# Patient Record
Sex: Male | Born: 1955 | Race: White | Hispanic: No | Marital: Married | Smoking: Never smoker
Health system: Southern US, Community
[De-identification: ages and names within clinical notes are randomized; demographics above are authoritative.]

## PROBLEM LIST (undated history)

## (undated) DIAGNOSIS — E119 Type 2 diabetes mellitus without complications: Secondary | ICD-10-CM

## (undated) DIAGNOSIS — Z9889 Other specified postprocedural states: Secondary | ICD-10-CM

## (undated) DIAGNOSIS — R112 Nausea with vomiting, unspecified: Secondary | ICD-10-CM

## (undated) DIAGNOSIS — C61 Malignant neoplasm of prostate: Secondary | ICD-10-CM

## (undated) DIAGNOSIS — K219 Gastro-esophageal reflux disease without esophagitis: Secondary | ICD-10-CM

## (undated) HISTORY — PX: TOENAIL EXCISION: SUR558

## (undated) HISTORY — PX: HERNIA REPAIR: SHX51

## (undated) HISTORY — PX: NOSE SURGERY: SHX723

---

## 2007-12-31 ENCOUNTER — Ambulatory Visit: Payer: Self-pay | Admitting: Family Medicine

## 2009-02-12 ENCOUNTER — Ambulatory Visit: Payer: Self-pay | Admitting: Unknown Physician Specialty

## 2009-07-11 ENCOUNTER — Ambulatory Visit: Payer: Self-pay | Admitting: Internal Medicine

## 2009-08-19 ENCOUNTER — Ambulatory Visit: Payer: Self-pay | Admitting: Internal Medicine

## 2010-03-14 ENCOUNTER — Ambulatory Visit: Payer: Self-pay | Admitting: Internal Medicine

## 2020-12-11 ENCOUNTER — Other Ambulatory Visit: Payer: Self-pay | Admitting: Urology

## 2020-12-11 DIAGNOSIS — R972 Elevated prostate specific antigen [PSA]: Secondary | ICD-10-CM

## 2020-12-24 ENCOUNTER — Other Ambulatory Visit: Payer: Self-pay | Admitting: Urology

## 2020-12-24 DIAGNOSIS — Z1389 Encounter for screening for other disorder: Secondary | ICD-10-CM

## 2020-12-24 DIAGNOSIS — R972 Elevated prostate specific antigen [PSA]: Secondary | ICD-10-CM

## 2020-12-25 ENCOUNTER — Other Ambulatory Visit: Payer: Self-pay

## 2020-12-25 ENCOUNTER — Ambulatory Visit
Admission: RE | Admit: 2020-12-25 | Discharge: 2020-12-25 | Disposition: A | Discharge status: Home / Self Care | Payer: BC Managed Care – PPO | Source: Ambulatory Visit | Attending: Urology | Admitting: Urology

## 2020-12-25 DIAGNOSIS — R972 Elevated prostate specific antigen [PSA]: Secondary | ICD-10-CM | POA: Insufficient documentation

## 2020-12-25 DIAGNOSIS — Z1389 Encounter for screening for other disorder: Secondary | ICD-10-CM | POA: Diagnosis present

## 2020-12-25 MED ORDER — GADOBUTROL 1 MMOL/ML IV SOLN
9.0000 mL | Freq: Once | INTRAVENOUS | Status: AC | PRN
  Administered 2020-12-25: 9 mL via INTRAVENOUS

## 2021-01-23 ENCOUNTER — Other Ambulatory Visit: Payer: BC Managed Care – PPO

## 2021-01-29 ENCOUNTER — Other Ambulatory Visit: Payer: BC Managed Care – PPO

## 2021-02-19 ENCOUNTER — Inpatient Hospital Stay
Admission: RE | Admit: 2021-02-19 | Discharge: 2021-02-19 | Disposition: A | Discharge status: Home / Self Care | Payer: BC Managed Care – PPO | Source: Ambulatory Visit | Attending: Urology | Admitting: Urology

## 2021-02-19 NOTE — Patient Instructions (Addendum)
Your procedure is scheduled on: February 28, 2021 THURSDAY Report to the Registration Desk on the 1st floor of the [facility]. To find out your arrival time, please call [phone] between 1PM - 3PM on: Wednesday June 8,2022  REMEMBER: Instructions that are not followed completely may result in serious medical risk, up to and including death; or upon the discretion of your surgeon and anesthesiologist your surgery may need to be rescheduled.  Do not eat food after midnight the night before surgery.  No gum chewing, lozengers or hard candies.  You may however, drink CLEAR liquids up to 2 hours before you are scheduled to arrive for your surgery. Do not drink anything within 2 hours of your scheduled arrival time.  Clear liquids include: - water  Do NOT drink anything that is not on this list.  Type 1 and Type 2 diabetics should only drink water.  TAKE THESE MEDICATIONS THE MORNING OF SURGERY WITH A SIP OF WATER:  (take one the night before and one on the morning of surgery - helps to prevent nausea after surgery.)  Use inhalers on the day of surgery and bring to the hospital.  Stop Metformin 2 days prior to surgery. LAST DOSE OF METFORMIN February 25, 2021 Monday. LAST DOSE OF INVOKANA (CANAGLIFLOZIN) IS February 24, 2021 SUNDAY  Take 1/2 of usual insulin dose the night before surgery and none on the morning of surgery.  Follow recommendations from Cardiologist, Pulmonologist or PCP regarding stopping Aspirin, Coumadin, Plavix, Eliquis, Pradaxa, or Pletal.  One week prior to surgery: Stop Anti-inflammatories (NSAIDS) such as Advil, Aleve, Ibuprofen, Motrin, Naproxen, Naprosyn and Aspirin based products such as Excedrin, Goodys Powder, BC Powder. Stop ANY OVER THE COUNTER supplements until after surgery. You may however, continue to take Tylenol if needed for pain up until the day of surgery.  No Alcohol for 24 hours before or after surgery.  No Smoking including e-cigarettes for 24 hours  prior to surgery.  No chewable tobacco products for at least 6 hours prior to surgery.  No nicotine patches on the day of surgery.  Do not use any "recreational" drugs for at least a week prior to your surgery.  Please be advised that the combination of cocaine and anesthesia may have negative outcomes, up to and including death. If you test positive for cocaine, your surgery will be cancelled.  On the morning of surgery brush your teeth with toothpaste and water, you may rinse your mouth with mouthwash if you wish. Do not swallow any toothpaste or mouthwash.  Do not wear jewelry, make-up, hairpins, clips or nail polish.  Do not wear lotions, powders, or perfumes.   Do not shave body from the neck down 48 hours prior to surgery just in case you cut yourself which could leave a site for infection.  Also, freshly shaved skin may become irritated if using the CHG soap.  Contact lenses, hearing aids and dentures may not be worn into surgery.  Do not bring valuables to the hospital. [facility] is not responsible for any missing/lost belongings or valuables.    Use CHG Soap as directed on instruction sheet.  Fleets enema as directed until clear.  Bring your C-PAP to the hospital with you in case you may have to spend the night.   Notify your doctor if there is any change in your medical condition (cold, fever, infection).  Wear comfortable clothing (specific to your surgery type) to the hospital.  Plan for stool softeners for home  use; pain medications have a tendency to cause constipation. You can also help prevent constipation by eating foods high in fiber such as fruits and vegetables and drinking plenty of fluids as your diet allows.  After surgery, you can help prevent lung complications by doing breathing exercises.  Take deep breaths and cough every 1-2 hours. Your doctor may order a device called an Incentive Spirometer to help you take deep breaths. When coughing or sneezing,  hold a pillow firmly against your incision with both hands. This is called "splinting." Doing this helps protect your incision. It also decreases belly discomfort.  If you are being admitted to the hospital overnight, leave your suitcase in the car. After surgery it may be brought to your room.  If you are being discharged the day of surgery, you will not be allowed to drive home. You will need a responsible adult (18 years or older) to drive you home and stay with you that night.   If you are taking public transportation, you will need to have a responsible adult (18 years or older) with you. Please confirm with your physician that it is acceptable to use public transportation.   Please call the [place] [facility] at [phone] if you have any questions about these instructions.  Surgery Visitation Policy:  Patients undergoing a surgery or procedure may have one family member or support person with them as long as that person is not COVID-19 positive or experiencing its symptoms.  That person may remain in the waiting area during the procedure.  Inpatient Visitation:    Visiting hours are 7 a.m. to 8 p.m. Inpatients will be allowed two visitors daily. The visitors may change each day during the patient's stay. No visitors under the age of 23. Any visitor under the age of 80 must be accompanied by an adult. The visitor must pass COVID-19 screenings, use hand sanitizer when entering and exiting the patient's room and wear a mask at all times, including in the patient's room. Patients must also wear a mask when staff or their visitor are in the room. Masking is required regardless of vaccination status.  Total Hip/Knee Replacement Preoperative Educational Video  To better prepare for surgery, please view our videos that explain the physical activity and discharge planning required to have the best surgical recovery at [facility].  [email or web address]      Questions? Call [phone] or email [email or web address]

## 2021-02-19 NOTE — H&P (Signed)
[name], [name] MEDICAL RECORD NO: [id number] ACCOUNT NO: [id number] DATE OF BIRTH: Jan 27, 1956 PHYSICIAN: Otelia Limes. Yves Dill, MD  History and Physical   DATE OF ADMISSION: 02/28/2021  Same day surgery 02/28/2021.  CHIEF COMPLAINT:  Elevated PSA and abnormal prostate gland MRI scan.  HISTORY OF PRESENT ILLNESS:  The patient is a 65 year old Caucasian male who was found to have an elevated PSA of 6.6 ng/mL, on 12/06/2020.  This was subsequently evaluated with a prostate gland MRI scan on 12/25/2020, which indicated a 37.8 mL prostate  and a 2 PI-RADS category 4 lesions in the right mid gland extending to the apex suspicious for high-grade prostate cancer.  The patient comes in now for UroNav fusion biopsy of the prostate.  PAST MEDICAL HISTORY: ALLERGIES:  ALLERGIC TO CODEINE.  CURRENT MEDICATIONS:  Included Nexium, Invokana, metformin, Ozempic, Lantus insulin, olmesartan, baby aspirin, tadalafil and AndroGel.  PAST SURGICAL HISTORY:  1.  Left inguinal herniorrhaphy in 1960. 2.  Nose surgery. 3.  Toe surgery in 1960s.  PAST AND CURRENT MEDICAL CONDITIONS: 1.  Type 2 diabetes. 2.  Hypertriglyceridemia. 3.  Albuminuria. 4.  Hypogonadism. 5.  Erectile dysfunction. 6.  Vitamin B12 deficiency. 7.  Allergic rhinitis. 8.  GERD.  REVIEW OF SYSTEMS:  Patient denies chest pain, shortness of breath, stroke or hypertension or coronary artery disease.  SOCIAL HISTORY:  Patient denied tobacco or alcohol use.  FAMILY HISTORY:  The patient has a brother with diabetes.  There is no family history of prostate cancer.  PHYSICAL EXAMINATION: VITAL SIGNS:  Height 5 feet 6 inches, weight 199 pounds, BMI 32. GENERAL:  Well-nourished white male in no acute distress. HEENT:  Sclerae were clear.  Pupils are equally round, reactive to light and accommodation.  Extraocular movements are intact. NECK:  No palpable masses and no audible carotid bruits. PULMONARY:  Lungs are clear to  auscultation. CARDIOVASCULAR:  Regular rhythm and rate. LYMPHATIC:  No palpable cervical or inguinal adenopathy. GENITOURINARY:  Patient was circumcised.  Testes were both smooth, nontender, approximately 20 mL in size each. RECTAL:  35 gram smooth, nontender prostate. NEUROLOGICAL:  Alert and oriented x3.  IMPRESSION:  1.  Elevated PSA. 2.  Abnormal prostate MRI scan with 2 PI-RADS category 4 lesions.  PLAN:  UroNav fusion biopsy of the prostate.   PUS D: 02/14/2021 5:37:28 pm T: 02/14/2021 6:17:00 pm  JOB: [id number]/ [id number]

## 2021-02-21 ENCOUNTER — Encounter
Admission: RE | Admit: 2021-02-21 | Discharge: 2021-02-21 | Disposition: A | Discharge status: Home / Self Care | Payer: BC Managed Care – PPO | Source: Ambulatory Visit | Attending: Urology | Admitting: Urology

## 2021-02-21 ENCOUNTER — Other Ambulatory Visit: Payer: Self-pay

## 2021-02-21 HISTORY — DX: Type 2 diabetes mellitus without complications: E11.9

## 2021-02-21 HISTORY — DX: Other specified postprocedural states: R11.2

## 2021-02-21 HISTORY — DX: Gastro-esophageal reflux disease without esophagitis: K21.9

## 2021-02-21 HISTORY — DX: Other specified postprocedural states: Z98.890

## 2021-02-21 NOTE — Patient Instructions (Addendum)
Your procedure is scheduled on: 02/28/21 Report to [place]. To find out your arrival time please call [phone] between 1PM - 3PM on 02/27/21.  Remember: Instructions that are not followed completely may result in serious medical risk, up to and including death, or upon the discretion of your surgeon and anesthesiologist your surgery may need to be rescheduled.     _X__ 1. Do not eat food after midnight the night before your procedure.                 No gum chewing or hard candies. You may drink clear liquids up to 2 hours                 before you are scheduled to arrive for your surgery-                   Diabetics water only  __X__2.  On the morning of surgery brush your teeth with toothpaste and water, you                 may rinse your mouth with mouthwash if you wish.  Do not swallow any              toothpaste of mouthwash.     _X__ 3.  No Alcohol for 24 hours before or after surgery.   _X__ 4.  Do Not Smoke or use e-cigarettes For 24 Hours Prior to Your Surgery.                 Do not use any chewable tobacco products for at least 6 hours prior to                 surgery.  ____  5.  Bring all medications with you on the day of surgery if instructed.   __X__  6.  Notify your doctor if there is any change in your medical condition      (cold, fever, infections).     Do not wear jewelry, make-up, hairpins, clips or nail polish. Do not wear lotions, powders, or perfumes. You may put on a small amount of deodorant. Do not shave body hair 48 hours prior to surgery. Men may shave face and neck. Do not bring valuables to the hospital.    [facility] is not responsible for any belongings or valuables.  Contacts, dentures/partials or body piercings may not be worn into surgery. Bring a case for your contacts, glasses or hearing aids, a denture cup will be supplied. Leave your suitcase in the car. After surgery it may be  brought to your room. For patients admitted to the hospital, discharge time is determined by your treatment team.   Patients discharged the day of surgery will not be allowed to drive home.   Please read over the following fact sheets that you were given:     __X__ Take these medicines the morning of surgery with A SIP OF WATER:    1. esomeprazole (NEXIUM) 40 MG capsule  2.   3.   4.  5.  6.  __X__ Fleet Enema (as directed) UNTIL CLEAR (no poop)  ____ Use CHG Soap/SAGE wipes as directed  ____ Use inhalers on the day of surgery  __X__ Stop Metformin 48 hours before surgery, Invokana 72 hours before surgery   __X__ Decrease bedtime insulin dose to 20 units.. No insulin the morning  of surgery.   ____ Stop Blood Thinners Coumadin/Plavix/Xarelto/Pleta/Pradaxa/Eliquis/Effient/Aspirin  on   Or contact your Surgeon, Cardiologist or Medical Doctor regarding  ability to stop your blood thinners  __X__ Stop Anti-inflammatories 7 days before surgery such as Advil, Ibuprofen, Motrin,  BC or Goodies Powder, Naprosyn, Naproxen, Aleve, Aspirin    __X__ Stop all herbal supplements, fish oil or vitamin E until after surgery.    ____ Bring C-Pap to the hospital.

## 2021-02-28 ENCOUNTER — Ambulatory Visit: Payer: BC Managed Care – PPO | Admitting: Certified Registered Nurse Anesthetist

## 2021-02-28 ENCOUNTER — Ambulatory Visit
Admission: RE | Admit: 2021-02-28 | Discharge: 2021-02-28 | Disposition: A | Discharge status: Home / Self Care | Payer: BC Managed Care – PPO | Attending: Urology | Admitting: Urology

## 2021-02-28 ENCOUNTER — Other Ambulatory Visit: Payer: Self-pay

## 2021-02-28 ENCOUNTER — Encounter
Admission: RE | Disposition: A | Discharge status: Home / Self Care | Payer: Self-pay | Source: Home / Self Care | Attending: Urology

## 2021-02-28 ENCOUNTER — Encounter: Payer: Self-pay | Admitting: Urology

## 2021-02-28 DIAGNOSIS — Z7984 Long term (current) use of oral hypoglycemic drugs: Secondary | ICD-10-CM | POA: Diagnosis not present

## 2021-02-28 DIAGNOSIS — Z885 Allergy status to narcotic agent status: Secondary | ICD-10-CM | POA: Diagnosis not present

## 2021-02-28 DIAGNOSIS — Z7982 Long term (current) use of aspirin: Secondary | ICD-10-CM | POA: Insufficient documentation

## 2021-02-28 DIAGNOSIS — R972 Elevated prostate specific antigen [PSA]: Secondary | ICD-10-CM | POA: Diagnosis present

## 2021-02-28 DIAGNOSIS — Z794 Long term (current) use of insulin: Secondary | ICD-10-CM | POA: Insufficient documentation

## 2021-02-28 DIAGNOSIS — Z79899 Other long term (current) drug therapy: Secondary | ICD-10-CM | POA: Diagnosis not present

## 2021-02-28 DIAGNOSIS — C61 Malignant neoplasm of prostate: Secondary | ICD-10-CM | POA: Insufficient documentation

## 2021-02-28 HISTORY — PX: PROSTATE BIOPSY: SHX241

## 2021-02-28 LAB — GLUCOSE, CAPILLARY
Glucose-Capillary: 149 mg/dL — ABNORMAL HIGH (ref 70–99)
Glucose-Capillary: 121 mg/dL — ABNORMAL HIGH (ref 70–99)

## 2021-02-28 SURGERY — BIOPSY, PROSTATE
Anesthesia: General

## 2021-02-28 MED ORDER — GLYCOPYRROLATE 0.2 MG/ML IJ SOLN
INTRAMUSCULAR | Status: AC
  Filled 2021-02-28: qty 1

## 2021-02-28 MED ORDER — PHENYLEPHRINE HCL (PRESSORS) 10 MG/ML IV SOLN
INTRAVENOUS | Status: AC
  Filled 2021-02-28: qty 1

## 2021-02-28 MED ORDER — GLYCOPYRROLATE 0.2 MG/ML IJ SOLN
INTRAMUSCULAR | Status: DC | PRN
  Administered 2021-02-28: .2 mg via INTRAVENOUS

## 2021-02-28 MED ORDER — FENTANYL CITRATE (PF) 100 MCG/2ML IJ SOLN
INTRAMUSCULAR | Status: DC | PRN
  Administered 2021-02-28: 100 ug via INTRAVENOUS

## 2021-02-28 MED ORDER — ACETAMINOPHEN 10 MG/ML IV SOLN
INTRAVENOUS | Status: AC
  Filled 2021-02-28: qty 100

## 2021-02-28 MED ORDER — ROCURONIUM BROMIDE 100 MG/10ML IV SOLN
INTRAVENOUS | Status: DC | PRN
  Administered 2021-02-28: 60 mg via INTRAVENOUS

## 2021-02-28 MED ORDER — ONDANSETRON HCL 4 MG/2ML IJ SOLN
INTRAMUSCULAR | Status: DC | PRN
  Administered 2021-02-28: 4 mg via INTRAVENOUS

## 2021-02-28 MED ORDER — GENTAMICIN SULFATE 40 MG/ML IJ SOLN
80.0000 mg | Freq: Once | INTRAVENOUS | Status: AC
  Administered 2021-02-28: 80 mg via INTRAVENOUS
  Filled 2021-02-28: qty 2

## 2021-02-28 MED ORDER — MIDAZOLAM HCL 2 MG/2ML IJ SOLN
INTRAMUSCULAR | Status: DC | PRN
  Administered 2021-02-28: 2 mg via INTRAVENOUS

## 2021-02-28 MED ORDER — ORAL CARE MOUTH RINSE: 15.0000 mL | Freq: Once | OROMUCOSAL | Status: AC

## 2021-02-28 MED ORDER — SUGAMMADEX SODIUM 200 MG/2ML IV SOLN
INTRAVENOUS | Status: DC | PRN
  Administered 2021-02-28: 200 mg via INTRAVENOUS

## 2021-02-28 MED ORDER — CHLORHEXIDINE GLUCONATE 0.12 % MT SOLN: 15.0000 mL | Freq: Once | OROMUCOSAL | Status: AC

## 2021-02-28 MED ORDER — LIDOCAINE HCL (PF) 2 % IJ SOLN
INTRAMUSCULAR | Status: AC
  Filled 2021-02-28: qty 5

## 2021-02-28 MED ORDER — MIDAZOLAM HCL 2 MG/2ML IJ SOLN
INTRAMUSCULAR | Status: AC
  Filled 2021-02-28: qty 2

## 2021-02-28 MED ORDER — CHLORHEXIDINE GLUCONATE 0.12 % MT SOLN
OROMUCOSAL | Status: AC
  Administered 2021-02-28: 15 mL via OROMUCOSAL
  Filled 2021-02-28: qty 15

## 2021-02-28 MED ORDER — DEXAMETHASONE SODIUM PHOSPHATE 10 MG/ML IJ SOLN
INTRAMUSCULAR | Status: DC | PRN
  Administered 2021-02-28: 10 mg via INTRAVENOUS

## 2021-02-28 MED ORDER — SODIUM CHLORIDE 0.9 % IV SOLN
INTRAVENOUS | Status: AC
  Filled 2021-02-28: qty 10

## 2021-02-28 MED ORDER — SODIUM CHLORIDE 0.9 % IV SOLN
1.0000 g | Freq: Once | INTRAVENOUS | Status: AC
  Administered 2021-02-28: 1 g via INTRAVENOUS

## 2021-02-28 MED ORDER — PROPOFOL 10 MG/ML IV BOLUS
INTRAVENOUS | Status: DC | PRN
  Administered 2021-02-28: 170 mg via INTRAVENOUS

## 2021-02-28 MED ORDER — ACETAMINOPHEN 10 MG/ML IV SOLN
INTRAVENOUS | Status: DC | PRN
  Administered 2021-02-28: 1000 mg via INTRAVENOUS

## 2021-02-28 MED ORDER — SODIUM CHLORIDE 0.9 % IV SOLN: INTRAVENOUS | Status: DC

## 2021-02-28 MED ORDER — FLEET ENEMA 7-19 GM/118ML RE ENEM: 1.0000 | ENEMA | Freq: Once | RECTAL | Status: DC

## 2021-02-28 MED ORDER — ONDANSETRON HCL 4 MG/2ML IJ SOLN
INTRAMUSCULAR | Status: AC
  Filled 2021-02-28: qty 2

## 2021-02-28 MED ORDER — FENTANYL CITRATE (PF) 100 MCG/2ML IJ SOLN
INTRAMUSCULAR | Status: AC
  Filled 2021-02-28: qty 2

## 2021-02-28 MED ORDER — PROPOFOL 10 MG/ML IV BOLUS
INTRAVENOUS | Status: AC
  Filled 2021-02-28: qty 40

## 2021-02-28 MED ORDER — PHENYLEPHRINE HCL (PRESSORS) 10 MG/ML IV SOLN
INTRAVENOUS | Status: DC | PRN
  Administered 2021-02-28 (×2): 100 ug via INTRAVENOUS

## 2021-02-28 MED ORDER — DEXAMETHASONE SODIUM PHOSPHATE 10 MG/ML IJ SOLN
INTRAMUSCULAR | Status: AC
  Filled 2021-02-28: qty 1

## 2021-02-28 MED ORDER — LEVOFLOXACIN 500 MG PO TABS: 500.0000 mg | ORAL_TABLET | Freq: Every day | ORAL | 1 refills | Status: AC

## 2021-02-28 MED ORDER — LIDOCAINE HCL (CARDIAC) PF 100 MG/5ML IV SOSY
PREFILLED_SYRINGE | INTRAVENOUS | Status: DC | PRN
  Administered 2021-02-28: 100 mg via INTRAVENOUS

## 2021-02-28 SURGICAL SUPPLY — 22 items
COVER MAYO STAND REUSABLE (DRAPES) ×3 IMPLANT
COVER TRANSDUCER ULTRASOUND (MISCELLANEOUS) ×2 IMPLANT
FEE DELIVERY LASER CO2 FORTEC (MISCELLANEOUS) IMPLANT
GLOVE SURG ENC MOIS LTX SZ7 (GLOVE) ×6 IMPLANT
GUIDE NDL ENDOCAV 16-18 CVR (NEEDLE) IMPLANT
GUIDE NDL URONAV ULTRASND S (MISCELLANEOUS) IMPLANT
GUIDE NEEDLE ENDOCAV 16-18 CVR (NEEDLE) IMPLANT
GUIDE NEEDLE URONAV ULTRASND S (MISCELLANEOUS) ×1 IMPLANT
INST BIOPSY MAXCORE 18GX25 (NEEDLE) ×3 IMPLANT
LASER CO2 FORTEC DELIVERY FEE (MISCELLANEOUS) ×3 IMPLANT
MANIFOLD NEPTUNE II (INSTRUMENTS) ×1 IMPLANT
NDL GUIDE BIOPSY 644068 (NEEDLE) IMPLANT
NEEDLE GUIDE BIOPSY 644068 (NEEDLE) IMPLANT
PROBE BIOSP ALOKA ALPHA6 PROST (MISCELLANEOUS) ×2 IMPLANT
PROBE URONAV BK 8808E 8818 HLD (MISCELLANEOUS) IMPLANT
STRAP SAFETY 5IN WIDE (MISCELLANEOUS) ×3 IMPLANT
SURGILUBE 2OZ TUBE FLIPTOP (MISCELLANEOUS) ×3 IMPLANT
TOWEL OR 17X26 4PK STRL BLUE (TOWEL DISPOSABLE) ×3 IMPLANT
URONAV BK 8808E 8818 PROBE HLD (MISCELLANEOUS) ×3
URONAV MRI FUSION TWO PATIENTS (MISCELLANEOUS) ×2 IMPLANT
URONAV ULTRASOUND (MISCELLANEOUS) ×2 IMPLANT
URONAV ULTRASOUND NDL GUIDE S (MISCELLANEOUS) ×3

## 2021-02-28 NOTE — Op Note (Signed)
Preoperative diagnosis: 1.  Elevated PSA (R97.2)                                           2.  Abnormal prostate gland MRI scan (D40.0)  Postoperative diagnosis: Same  Procedure: Transrectal Uronav fusion biopsy of the prostate gland ([facility], [phone])  Surgeon: Otelia Limes. Yves Dill MD  Anesthesia: General  Indications:See the history and physical. After informed consent the above procedure(s) were requested     Technique and findings: After adequate general anesthesia been obtained the patient was placed into left lateral decubitus position.  Digital sweep of the rectum indicated that the rectal vault was clear.  The ultrasound probe was then placed and edges acquired.  Ultrasound images were then fused with the MRI images.  Region of interest #1 was identified and 3 core biopsies obtained from this location.  Region of interest #2 was identified and 3 core biopsies obtained from this location.  At this point standard 12 core systematic core biopsies were performed.  The ultrasound probe was then removed.  Blood loss was minimal.  The procedure was then terminated and patient transferred to the recovery room in stable condition.

## 2021-02-28 NOTE — Anesthesia Preprocedure Evaluation (Addendum)
Anesthesia Evaluation  Patient identified by MRN, date of birth, ID band Patient awake    History of Anesthesia Complications Negative for: history of anesthetic complications  Airway Mallampati: I  TM Distance: >3 FB     Dental no notable dental hx.    Pulmonary neg pulmonary ROS,    Pulmonary exam normal breath sounds clear to auscultation       Cardiovascular Exercise Tolerance: Good hypertension (controlled), Pt. on medications Normal cardiovascular exam Rhythm:Regular Rate:Normal     Neuro/Psych negative neurological ROS  negative psych ROS   GI/Hepatic Neg liver ROS, hiatal hernia (controlled),   Endo/Other  diabetes, Well Controlled  Renal/GU negative Renal ROS  negative genitourinary   Musculoskeletal negative musculoskeletal ROS (+)   Abdominal Normal abdominal exam  (+)   Peds negative pediatric ROS (+)  Hematology negative hematology ROS (+)   Anesthesia Other Findings   Reproductive/Obstetrics negative OB ROS                           Anesthesia Physical Anesthesia Plan  ASA: 2  Anesthesia Plan: General   Post-op Pain Management:    Induction: Intravenous  PONV Risk Score and Plan: Ondansetron  Airway Management Planned: Oral ETT  Additional Equipment:   Intra-op Plan:   Post-operative Plan:   Informed Consent: I have reviewed the patients History and Physical, chart, labs and discussed the procedure including the risks, benefits and alternatives for the proposed anesthesia with the patient or authorized representative who has indicated his/her understanding and acceptance.       Plan Discussed with: CRNA  Anesthesia Plan Comments:         Anesthesia Quick Evaluation

## 2021-02-28 NOTE — Anesthesia Procedure Notes (Signed)
Procedure Name: Intubation Date/Time: 02/28/2021 7:58 AM Performed by: Lily Peer, Kariann Wecker, CRNA Pre-anesthesia Checklist: Patient identified, Emergency Drugs available, Suction available and Patient being monitored Patient Re-evaluated:Patient Re-evaluated prior to induction Oxygen Delivery Method: Circle system utilized Preoxygenation: Pre-oxygenation with 100% oxygen Induction Type: IV induction Ventilation: Mask ventilation without difficulty Laryngoscope Size: McGraph and 4 Grade View: Grade I Tube type: Oral Number of attempts: 1 Airway Equipment and Method: Stylet Placement Confirmation: ETT inserted through vocal cords under direct vision, positive ETCO2 and breath sounds checked- equal and bilateral Secured at: 21 cm Tube secured with: Tape Dental Injury: Teeth and Oropharynx as per pre-operative assessment

## 2021-02-28 NOTE — H&P (Signed)
Date of Initial H&P: 02/14/21  History reviewed, patient examined, no change in status, stable for surgery.

## 2021-02-28 NOTE — Transfer of Care (Signed)
Immediate Anesthesia Transfer of Care Note  Patient: [name]  Procedure(s) Performed: PROSTATE BIOPSY URONAV PROSTATE  Patient Location: PACU  Anesthesia Type:General  Level of Consciousness: drowsy  Airway & Oxygen Therapy: Patient Spontanous Breathing and Patient connected to face mask oxygen  Post-op Assessment: Report given to RN and Post -op Vital signs reviewed and stable  Post vital signs: Reviewed and stable  Last Vitals:  Vitals Value Taken Time  BP 106/71 02/28/21 0826  Temp    Pulse 56 02/28/21 0828  Resp 18 02/28/21 0828  SpO2 99 % 02/28/21 0828  Vitals shown include unvalidated device data.  Last Pain:  Vitals:   02/28/21 0610  TempSrc: Oral  PainSc: 4          Complications: No notable events documented.

## 2021-02-28 NOTE — Discharge Instructions (Signed)

## 2021-03-01 LAB — SURGICAL PATHOLOGY

## 2021-03-01 NOTE — Anesthesia Postprocedure Evaluation (Signed)
Anesthesia Post Note  Patient: [name]  Procedure(s) Performed: PROSTATE BIOPSY [place] PROSTATE  Patient location during evaluation: PACU Anesthesia Type: General Level of consciousness: awake and alert Pain management: pain level controlled Vital Signs Assessment: post-procedure vital signs reviewed and stable Respiratory status: spontaneous breathing, nonlabored ventilation, respiratory function stable and patient connected to nasal cannula oxygen Cardiovascular status: blood pressure returned to baseline and stable Postop Assessment: no apparent nausea or vomiting Anesthetic complications: no   No notable events documented.   Last Vitals:  Vitals:   02/28/21 0857 02/28/21 0913  BP: 121/87 127/84  Pulse: 64 64  Resp: 13 16  Temp: (!) 36.1 C (!) 36.1 C  SpO2: 99% 99%    Last Pain:  Vitals:   03/01/21 0808  TempSrc:   PainSc: 0-No pain                 Tonny Bollman

## 2021-03-04 ENCOUNTER — Encounter: Payer: Self-pay | Admitting: Urology

## 2022-01-23 ENCOUNTER — Emergency Department (HOSPITAL_COMMUNITY): Payer: BC Managed Care – PPO

## 2022-01-23 ENCOUNTER — Emergency Department (HOSPITAL_COMMUNITY)
Admission: EM | Admit: 2022-01-23 | Discharge: 2022-01-23 | Disposition: A | Discharge status: Home / Self Care | Payer: BC Managed Care – PPO | Attending: Emergency Medicine | Admitting: Emergency Medicine

## 2022-01-23 ENCOUNTER — Other Ambulatory Visit: Payer: Self-pay

## 2022-01-23 ENCOUNTER — Encounter (HOSPITAL_COMMUNITY): Payer: Self-pay | Admitting: Emergency Medicine

## 2022-01-23 DIAGNOSIS — Z794 Long term (current) use of insulin: Secondary | ICD-10-CM | POA: Insufficient documentation

## 2022-01-23 DIAGNOSIS — Z7984 Long term (current) use of oral hypoglycemic drugs: Secondary | ICD-10-CM | POA: Diagnosis not present

## 2022-01-23 DIAGNOSIS — Z7982 Long term (current) use of aspirin: Secondary | ICD-10-CM | POA: Insufficient documentation

## 2022-01-23 DIAGNOSIS — E119 Type 2 diabetes mellitus without complications: Secondary | ICD-10-CM | POA: Insufficient documentation

## 2022-01-23 DIAGNOSIS — R4182 Altered mental status, unspecified: Secondary | ICD-10-CM | POA: Diagnosis present

## 2022-01-23 DIAGNOSIS — R410 Disorientation, unspecified: Secondary | ICD-10-CM | POA: Diagnosis not present

## 2022-01-23 DIAGNOSIS — Z8546 Personal history of malignant neoplasm of prostate: Secondary | ICD-10-CM | POA: Diagnosis not present

## 2022-01-23 DIAGNOSIS — R600 Localized edema: Secondary | ICD-10-CM | POA: Diagnosis not present

## 2022-01-23 HISTORY — DX: Malignant neoplasm of prostate: C61

## 2022-01-23 LAB — COMPREHENSIVE METABOLIC PANEL
ALT: 13 U/L (ref 0–44)
AST: 16 U/L (ref 15–41)
Albumin: 3.6 g/dL (ref 3.5–5.0)
Alkaline Phosphatase: 65 U/L (ref 38–126)
Anion gap: 12 (ref 5–15)
BUN: 15 mg/dL (ref 8–23)
CO2: 21 mmol/L — ABNORMAL LOW (ref 22–32)
Calcium: 9.2 mg/dL (ref 8.9–10.3)
Chloride: 106 mmol/L (ref 98–111)
Creatinine, Ser: 1.35 mg/dL — ABNORMAL HIGH (ref 0.61–1.24)
GFR, Estimated: 59 mL/min — ABNORMAL LOW (ref >60)
Glucose, Bld: 189 mg/dL — ABNORMAL HIGH (ref 70–99)
Potassium: 4.1 mmol/L (ref 3.5–5.1)
Sodium: 139 mmol/L (ref 135–145)
Total Bilirubin: 0.9 mg/dL (ref 0.3–1.2)
Total Protein: 6.3 g/dL — ABNORMAL LOW (ref 6.5–8.1)

## 2022-01-23 LAB — CBC WITH DIFFERENTIAL/PLATELET
Abs Immature Granulocytes: 0.02 10*3/uL (ref 0.00–0.07)
Basophils Absolute: 0 10*3/uL (ref 0.0–0.1)
Basophils Relative: 1 %
Eosinophils Absolute: 0.2 10*3/uL (ref 0.0–0.5)
Eosinophils Relative: 2 %
HCT: 38 % — ABNORMAL LOW (ref 39.0–52.0)
Hemoglobin: 12.6 g/dL — ABNORMAL LOW (ref 13.0–17.0)
Immature Granulocytes: 0 %
Lymphocytes Relative: 32 %
Lymphs Abs: 2.6 10*3/uL (ref 0.7–4.0)
MCH: 28.4 pg (ref 26.0–34.0)
MCHC: 33.2 g/dL (ref 30.0–36.0)
MCV: 85.6 fL (ref 80.0–100.0)
Monocytes Absolute: 0.5 10*3/uL (ref 0.1–1.0)
Monocytes Relative: 6 %
Neutro Abs: 4.8 10*3/uL (ref 1.7–7.7)
Neutrophils Relative %: 59 %
Platelets: 262 10*3/uL (ref 150–400)
RBC: 4.44 MIL/uL (ref 4.22–5.81)
RDW: 13.1 % (ref 11.5–15.5)
WBC: 8.1 10*3/uL (ref 4.0–10.5)
nRBC: 0 % (ref 0.0–0.2)

## 2022-01-23 LAB — I-STAT VENOUS BLOOD GAS, ED
Acid-base deficit: 3 mmol/L — ABNORMAL HIGH (ref 0.0–2.0)
Bicarbonate: 21.4 mmol/L (ref 20.0–28.0)
Calcium, Ion: 1.19 mmol/L (ref 1.15–1.40)
HCT: 36 % — ABNORMAL LOW (ref 39.0–52.0)
Hemoglobin: 12.2 g/dL — ABNORMAL LOW (ref 13.0–17.0)
O2 Saturation: 83 %
Potassium: 3.7 mmol/L (ref 3.5–5.1)
Sodium: 138 mmol/L (ref 135–145)
TCO2: 22 mmol/L (ref 22–32)
pCO2, Ven: 34 mmHg — ABNORMAL LOW (ref 44–60)
pH, Ven: 7.407 (ref 7.25–7.43)
pO2, Ven: 47 mmHg — ABNORMAL HIGH (ref 32–45)

## 2022-01-23 LAB — URINALYSIS, ROUTINE W REFLEX MICROSCOPIC
Bilirubin Urine: NEGATIVE
Glucose, UA: >=500 mg/dL — AB
Hgb urine dipstick: NEGATIVE
Ketones, ur: NEGATIVE mg/dL
Leukocytes,Ua: NEGATIVE
Nitrite: NEGATIVE
Protein, ur: NEGATIVE mg/dL
Specific Gravity, Urine: 1.02 (ref 1.005–1.030)
pH: 5 (ref 5.0–8.0)

## 2022-01-23 LAB — CBG MONITORING, ED: Glucose-Capillary: 185 mg/dL — ABNORMAL HIGH (ref 70–99)

## 2022-01-23 LAB — AMMONIA: Ammonia: 17 umol/L (ref 9–35)

## 2022-01-23 LAB — LACTIC ACID, PLASMA: Lactic Acid, Venous: 1.4 mmol/L (ref 0.5–1.9)

## 2022-01-23 LAB — BRAIN NATRIURETIC PEPTIDE: B Natriuretic Peptide: 35.5 pg/mL (ref 0.0–100.0)

## 2022-01-23 NOTE — Discharge Instructions (Signed)
You have been evaluated for confusion and concern for your heart.  ? ?Your labs did not show anything acute.  Your CT head did not show anything acute.  Your chest x-ray did not show anything acute. ? ?Please follow-up with your primary care provider in 2 to 3 days for reevaluation.  Come back to the emergency department for worsening symptoms or if you have acute chest pain.  ?

## 2022-01-23 NOTE — ED Triage Notes (Addendum)
Patient sent to ED by PCP with wife with complaint of altered mental status that started approximately six months ago. Wife states patient was sent for brain and cardiac evaluation. Patient states he was recently told his prostate cancer is now in remission. Patient is alert, oriented, and in no apparent distress at this time. ?

## 2022-01-23 NOTE — ED Provider Triage Note (Signed)
Emergency Medicine Provider Triage Evaluation Note ? ?Roselind Rily , a 66 y.o. male  was evaluated in triage.  Pt complains of patient sent in by his physicians for 6 to 7 weeks of worsening mental condition, memory issues, having trouble driving and remembering things.  Wife states she was told by both his urologist and his endocrinologist that he needed to come emergently to the emergency department to get his brain and heart checked. ? ?Review of Systems  ?Positive: Memory issues ?Negative: Chest pain ? ?Physical Exam  ?BP (!) 144/89 (BP Location: Right Arm)   Pulse 76   Temp 99 ?F (37.2 ?C) (Oral)   Resp 16   SpO2 95%  ?Gen:   Awake, no distress   ?Resp:  Normal effort  ?MSK:   Moves extremities without difficulty  ?Other:  No neurodeficits ? ?Medical Decision Making  ?Medically screening exam initiated at 3:48 PM.  Appropriate orders placed.  MOISHE SCHELLENBERG was informed that the remainder of the evaluation will be completed by another provider, this initial triage assessment does not replace that evaluation, and the importance of remaining in the ED until their evaluation is complete. ? ?Patient has a current urinary tract infection, work-up initiated ?  ?Margarita Mail, PA-C ?01/23/22 1549 ? ?

## 2022-01-23 NOTE — ED Provider Notes (Signed)
?[place] ?Provider Note ? ? ?CSN: [id number] ?Arrival date & time: 01/23/22  1502 ? ?  ? ?History ? ?Chief Complaint  ?Patient presents with  ? Altered Mental Status  ? ? ?[name] is a 66 y.o. male. ? ? ?Altered Mental Status ? ?Patient is 67 year old male history of prostate cancer on remission, DM who presents to the emergency department with his wife due to concern for confusion for the past 9 months.  His wife reports that he is currently being evaluated for hydrocele repair by urology.  His urology doctor Dr. Lubertha South informed them that he was concerned about his heart.  They called Dr. Posey Pronto his endocrinologist doctor who informed him to come to the emergency department for evaluation.  However there are no actual documentation exist from either doctors.  Patient reported he has been more confused for the past 6 months.  His wife reports whenever he drives he sometimes sorts and forgets where he is.  He does have trouble remembering things.  They are concerned that he is something that is causing this.  Patient also reports increased leg swelling for the past 6 months.  He also reports associated abdominal pain and exertional dyspnea.  Denies chest pain.  Denies fever, vomiting, or diarrhea.  He cannot specifically tell what is wrong but he feels " heavy and not himself".  Denies any urinary symptoms.  He does have intermittent diarrhea that has been going on for a while.  Otherwise no other complaints. ? ?Home Medications ?Prior to Admission medications   ?Medication Sig Start Date End Date Taking? Authorizing Provider  ?aspirin EC 81 MG tablet Take 81 mg by mouth daily. Swallow whole.    [provider]  ?canagliflozin (INVOKANA) 300 MG TABS tablet Take 300 mg by mouth daily before breakfast.    [provider]  ?esomeprazole (NEXIUM) 40 MG capsule Take 40 mg by mouth daily at 12 noon.    [provider]  ?insulin glargine (LANTUS) 100  UNIT/ML Solostar Pen Inject 40 Units into the skin at bedtime. 03/18/17   [provider]  ?levofloxacin (LEVAQUIN) 500 MG tablet Take 1 tablet (500 mg total) by mouth daily. 02/28/21   Royston Cowper, MD  ?metFORMIN (GLUCOPHAGE) 1000 MG tablet Take 1,000 mg by mouth 2 (two) times daily with a meal.    [provider]  ?olmesartan (BENICAR) 20 MG tablet Take 20 mg by mouth daily.    [provider]  ?OZEMPIC, 1 MG/DOSE, 4 MG/3ML SOPN Inject 1 mg into the skin every Sunday. 11/20/20   [provider]  ?vitamin B-12 (CYANOCOBALAMIN) 500 MCG tablet Take 500 mcg by mouth daily.    [provider]  ?   ? ?Allergies    ?Codeine and Lisinopril   ? ?Review of Systems   ?Review of Systems ? ?Physical Exam ?Updated Vital Signs ?BP 132/83   Pulse 63   Temp 98.5 ?F (36.9 ?C)   Resp (!) 23   Ht '5\' 7"'$  (1.702 m)   Wt 93 kg   SpO2 95%   BMI 32.11 kg/m?  ?Physical Exam ?Vitals and nursing note reviewed.  ?Constitutional:   ?   General: He is not in acute distress. ?   Appearance: He is well-developed.  ?HENT:  ?   Head: Normocephalic and atraumatic.  ?Eyes:  ?   Conjunctiva/sclera: Conjunctivae normal.  ?Cardiovascular:  ?   Rate and Rhythm: Normal rate and regular rhythm.  ?  Heart sounds: No murmur heard. ?Pulmonary:  ?   Effort: Pulmonary effort is normal. No respiratory distress.  ?   Breath sounds: Normal breath sounds.  ?Abdominal:  ?   General: There is distension.  ?   Palpations: Abdomen is soft.  ?   Tenderness: There is no abdominal tenderness.  ?   Comments: Mild diffuse distention of the abdomen  ?Musculoskeletal:     ?   General: No swelling.  ?   Cervical back: Neck supple.  ?   Right lower leg: Edema present.  ?   Left lower leg: Edema present.  ?Skin: ?   General: Skin is warm and dry.  ?   Capillary Refill: Capillary refill takes less than 2 seconds.  ?Neurological:  ?   General: No focal deficit present.  ?   Mental Status: He is alert and oriented to person,  place, and time.  ?   Sensory: No sensory deficit.  ?   Motor: No weakness.  ?Psychiatric:     ?   Mood and Affect: Mood normal.  ? ? ?ED Results / Procedures / Treatments   ?Labs ?(all labs ordered are listed, but only abnormal results are displayed) ?Labs Reviewed  ?COMPREHENSIVE METABOLIC PANEL - Abnormal; Notable for the following components:  ?    Result Value  ? CO2 21 (*)   ? Glucose, Bld 189 (*)   ? Creatinine, Ser 1.35 (*)   ? Total Protein 6.3 (*)   ? GFR, Estimated 59 (*)   ? All other components within normal limits  ?CBC WITH DIFFERENTIAL/PLATELET - Abnormal; Notable for the following components:  ? Hemoglobin 12.6 (*)   ? HCT 38.0 (*)   ? All other components within normal limits  ?URINALYSIS, ROUTINE W REFLEX MICROSCOPIC - Abnormal; Notable for the following components:  ? Color, Urine STRAW (*)   ? Glucose, UA >=500 (*)   ? Bacteria, UA RARE (*)   ? All other components within normal limits  ?CBG MONITORING, ED - Abnormal; Notable for the following components:  ? Glucose-Capillary 185 (*)   ? All other components within normal limits  ?I-STAT VENOUS BLOOD GAS, ED - Abnormal; Notable for the following components:  ? pCO2, Ven 34.0 (*)   ? pO2, Ven 47 (*)   ? Acid-base deficit 3.0 (*)   ? HCT 36.0 (*)   ? Hemoglobin 12.2 (*)   ? All other components within normal limits  ?AMMONIA  ?LACTIC ACID, PLASMA  ?BRAIN NATRIURETIC PEPTIDE  ? ? ?EKG ?EKG Interpretation ? ?Date/Time:  Thursday Jan 23 2022 15:29:39 EDT ?Ventricular Rate:  80 ?PR Interval:  164 ?QRS Duration: 80 ?QT Interval:  374 ?QTC Calculation: 431 ?R Axis:   30 ?Text Interpretation: Normal sinus rhythm Normal ECG When compared with ECG of 28-Feb-2021 06:31, No acute changes Confirmed by Madalyn Rob [id number]) on 01/23/2022 6:09:13 PM ? ?Radiology ?CT HEAD WO CONTRAST ? ?Result Date: 01/23/2022 ?CLINICAL DATA:  Altered mental status, nontraumatic EXAM: CT HEAD WITHOUT CONTRAST TECHNIQUE: Contiguous axial images were obtained from the base of the  skull through the vertex without intravenous contrast. RADIATION DOSE REDUCTION: This exam was performed according to the departmental dose-optimization program which includes automated exposure control, adjustment of the mA and/or kV according to patient size and/or use of iterative reconstruction technique. COMPARISON:  None Available. FINDINGS: Brain: There is no acute intracranial hemorrhage, mass effect, or edema. Gray-white differentiation is preserved. There is no extra-axial fluid collection. Prominence of the  ventricles and sulci reflects minor parenchymal volume loss. Vascular: No hyperdense vessel or unexpected calcification. Skull: Calvarium is unremarkable. Sinuses/Orbits: Evidence of prior sinonasal surgery with mild mucosal thickening. Orbits are unremarkable. Other: None. IMPRESSION: No acute or significant intracranial abnormality. Electronically Signed   By: Macy Mis M.D.   On: 01/23/2022 16:27  ? ?DG Chest Portable 1 View ? ?Result Date: 01/23/2022 ?CLINICAL DATA:  Altered mental status EXAM: PORTABLE CHEST 1 VIEW COMPARISON:  None Available. FINDINGS: Cardiac silhouette and mediastinal contours within normal limits. Mild calcification within aortic arch. The lungs are clear. No pleural effusion or pneumothorax. Moderate multilevel degenerative disc changes of the thoracic spine. IMPRESSION: No active disease. Electronically Signed   By: Yvonne Kendall M.D.   On: 01/23/2022 18:12   ? ?Procedures ?Procedures  ? ?Medications Ordered in ED ?Medications - No data to display ? ?ED Course/ Medical Decision Making/ A&P ?  ?                        ?Medical Decision Making ?Problems Addressed: ?Altered mental status, unspecified altered mental status type: acute illness or injury that poses a threat to life or bodily functions ?Confusion: acute illness or injury that poses a threat to life or bodily functions ? ?Amount and/or Complexity of Data Reviewed ?Labs: ordered. ?Radiology: ordered. ? ? ? ?This  is a 66 y.o. M presenting with altered mental status, concerning for confusion. Vitals signs are WNL. Physical exam as above. The differential includes toxic metabolic etiologies such as electrolyte disturbances (Na

## 2022-01-23 NOTE — ED Notes (Signed)
Discharge instructions reviewed with patient. Patient verbalized understanding of instructions. Follow-up care and medications were reviewed. Patient ambulatory with steady gait. VSS upon discharge.  ?

## 2022-01-27 ENCOUNTER — Emergency Department (HOSPITAL_COMMUNITY)
Admission: EM | Admit: 2022-01-27 | Discharge: 2022-01-27 | Disposition: A | Discharge status: Home / Self Care | Payer: BC Managed Care – PPO | Attending: Emergency Medicine | Admitting: Emergency Medicine

## 2022-01-27 ENCOUNTER — Encounter (HOSPITAL_COMMUNITY): Payer: Self-pay

## 2022-01-27 ENCOUNTER — Emergency Department (HOSPITAL_COMMUNITY): Payer: BC Managed Care – PPO

## 2022-01-27 ENCOUNTER — Other Ambulatory Visit: Payer: Self-pay

## 2022-01-27 DIAGNOSIS — Z794 Long term (current) use of insulin: Secondary | ICD-10-CM | POA: Diagnosis not present

## 2022-01-27 DIAGNOSIS — Z7984 Long term (current) use of oral hypoglycemic drugs: Secondary | ICD-10-CM | POA: Insufficient documentation

## 2022-01-27 DIAGNOSIS — E119 Type 2 diabetes mellitus without complications: Secondary | ICD-10-CM | POA: Diagnosis not present

## 2022-01-27 DIAGNOSIS — R1084 Generalized abdominal pain: Secondary | ICD-10-CM | POA: Insufficient documentation

## 2022-01-27 DIAGNOSIS — Z7982 Long term (current) use of aspirin: Secondary | ICD-10-CM | POA: Insufficient documentation

## 2022-01-27 DIAGNOSIS — R0689 Other abnormalities of breathing: Secondary | ICD-10-CM | POA: Insufficient documentation

## 2022-01-27 DIAGNOSIS — K219 Gastro-esophageal reflux disease without esophagitis: Secondary | ICD-10-CM | POA: Diagnosis not present

## 2022-01-27 DIAGNOSIS — R140 Abdominal distension (gaseous): Secondary | ICD-10-CM | POA: Diagnosis not present

## 2022-01-27 DIAGNOSIS — Z8546 Personal history of malignant neoplasm of prostate: Secondary | ICD-10-CM | POA: Insufficient documentation

## 2022-01-27 DIAGNOSIS — R339 Retention of urine, unspecified: Secondary | ICD-10-CM | POA: Diagnosis present

## 2022-01-27 LAB — URINALYSIS, ROUTINE W REFLEX MICROSCOPIC
Bacteria, UA: NONE SEEN
Bilirubin Urine: NEGATIVE
Glucose, UA: >=500 mg/dL — AB
Hgb urine dipstick: NEGATIVE
Ketones, ur: NEGATIVE mg/dL
Leukocytes,Ua: NEGATIVE
Nitrite: NEGATIVE
Protein, ur: NEGATIVE mg/dL
Specific Gravity, Urine: 1.017 (ref 1.005–1.030)
pH: 5 (ref 5.0–8.0)

## 2022-01-27 LAB — CBC
HCT: 39.6 % (ref 39.0–52.0)
Hemoglobin: 13.2 g/dL (ref 13.0–17.0)
MCH: 28.3 pg (ref 26.0–34.0)
MCHC: 33.3 g/dL (ref 30.0–36.0)
MCV: 85 fL (ref 80.0–100.0)
Platelets: 278 10*3/uL (ref 150–400)
RBC: 4.66 MIL/uL (ref 4.22–5.81)
RDW: 13 % (ref 11.5–15.5)
WBC: 8 10*3/uL (ref 4.0–10.5)
nRBC: 0 % (ref 0.0–0.2)

## 2022-01-27 LAB — COMPREHENSIVE METABOLIC PANEL
ALT: 12 U/L (ref 0–44)
AST: 16 U/L (ref 15–41)
Albumin: 3.8 g/dL (ref 3.5–5.0)
Alkaline Phosphatase: 68 U/L (ref 38–126)
Anion gap: 13 (ref 5–15)
BUN: 19 mg/dL (ref 8–23)
CO2: 17 mmol/L — ABNORMAL LOW (ref 22–32)
Calcium: 9.6 mg/dL (ref 8.9–10.3)
Chloride: 109 mmol/L (ref 98–111)
Creatinine, Ser: 1.34 mg/dL — ABNORMAL HIGH (ref 0.61–1.24)
GFR, Estimated: 60 mL/min — ABNORMAL LOW (ref >60)
Glucose, Bld: 160 mg/dL — ABNORMAL HIGH (ref 70–99)
Potassium: 3.8 mmol/L (ref 3.5–5.1)
Sodium: 139 mmol/L (ref 135–145)
Total Bilirubin: 0.7 mg/dL (ref 0.3–1.2)
Total Protein: 6.9 g/dL (ref 6.5–8.1)

## 2022-01-27 LAB — LIPASE, BLOOD: Lipase: 44 U/L (ref 11–51)

## 2022-01-27 LAB — BRAIN NATRIURETIC PEPTIDE: B Natriuretic Peptide: 27.4 pg/mL (ref 0.0–100.0)

## 2022-01-27 LAB — LACTIC ACID, PLASMA: Lactic Acid, Venous: 1.8 mmol/L (ref 0.5–1.9)

## 2022-01-27 MED ORDER — LIDOCAINE HCL URETHRAL/MUCOSAL 2 % EX GEL
1.0000 | Freq: Once | CUTANEOUS | Status: AC
Start: 2022-01-27 — End: 2022-01-27
  Administered 2022-01-27: 1 via TOPICAL
  Filled 2022-01-27: qty 11

## 2022-01-27 MED ORDER — LORAZEPAM 2 MG/ML IJ SOLN
1.0000 mg | Freq: Once | INTRAMUSCULAR | Status: AC
  Administered 2022-01-27: 1 mg via INTRAVENOUS
  Filled 2022-01-27: qty 1

## 2022-01-27 MED ORDER — LIDOCAINE HCL URETHRAL/MUCOSAL 2 % EX GEL
1.0000 "application " | Freq: Once | CUTANEOUS | Status: AC
  Administered 2022-01-27: 1 via TOPICAL
  Filled 2022-01-27: qty 11

## 2022-01-27 MED ORDER — IOHEXOL 300 MG/ML  SOLN
100.0000 mL | Freq: Once | INTRAMUSCULAR | Status: AC | PRN
  Administered 2022-01-27: 100 mL via INTRAVENOUS

## 2022-01-27 NOTE — ED Provider Triage Note (Addendum)
Emergency Medicine Provider Triage Evaluation Note ? ?[name] , a 66 y.o. male  was evaluated in triage.  Pt complains of lower abdominal pain, chills for the last few days. He reports that he had a UTI diagnosed 2 weeks ago, has been taking amoxicillin for it.  He reports that his dysuria is overall improved, denies significant hematuria.  He has a left-sided inguinal hernia that he is due for repair in June and is concerned that he may be having pain secondary to his hernia.  He denies nausea, vomiting, diarrhea. Patient also endorses some lower extremity edema which is new today ? ?Review of Systems  ?Positive: Abdominal pain, pelvic pain ?Negative: Nausea, vomiting, diarrhea ? ?Physical Exam  ?BP 104/88 (BP Location: Right Arm)   Pulse 95   Temp 98.5 ?F (36.9 ?C) (Oral)   Resp (!) 24   SpO2 96%  ?Gen:   Awake, no distress   ?Resp:  Normal effort  ?MSK:   Moves extremities without difficulty  ?Other:  Examination of the groin does not reveal incarcerated hernia, or strangulated hernia.  His tenderness to palpation is most focal suprapubically and into the left lower quadrant.  He does have some guarding, without rebound, rigidity. ? ?Medical Decision Making  ?Medically screening exam initiated at 10:12 AM.  Appropriate orders placed.  DARREK LEASURE was informed that the remainder of the evaluation will be completed by another provider, this initial triage assessment does not replace that evaluation, and the importance of remaining in the ED until their evaluation is complete. ? ?Workup initiated ?  ?Anselmo Pickler, PA-C ?01/27/22 1014 ? ?  ?Anselmo Pickler, PA-C ?01/27/22 1015 ? ?

## 2022-01-27 NOTE — ED Triage Notes (Signed)
Patient complains of recurrent right groin pain and concerned related to hernia that is scheduled to be repaired early June. Also reports incontinence ?

## 2022-01-27 NOTE — ED Provider Notes (Signed)
?[place] ?Provider Note ? ? ?CSN: [id number] ?Arrival date & time: 01/27/22  0630 ? ?  ? ?History ? ?Chief Complaint  ?Patient presents with  ? hernia/right groin  ? ? ?[name] is a 66 y.o. male. ? ?HPI ? ?66 year old male with medical history significant for DM 2, GERD, prostate cancer, chronic left sided inguinal hernia scheduled for surgical repair on 02/20/2022 who presents to the emergency department with a chief complaint of abdominal pain.  The patient states that his hernia normally is reducible and does not cause him significant discomfort.  He states that he recently reduced his hernia and has not recurred.  He is concerned there may be a complication associated with it.  He endorses some generalized bilateral lower abdominal discomfort in addition to abdominal distention.  He denies any nausea or vomiting.  His last bowel movement was last night and was normal.  He states that he is passing gas.  He does feel more distended than usual. ? ?Home Medications ?Prior to Admission medications   ?Medication Sig Start Date End Date Taking? Authorizing Provider  ?aspirin EC 81 MG tablet Take 81 mg by mouth daily. Swallow whole.    [provider]  ?canagliflozin (INVOKANA) 300 MG TABS tablet Take 300 mg by mouth daily before breakfast.    [provider]  ?esomeprazole (NEXIUM) 40 MG capsule Take 40 mg by mouth daily at 12 noon.    [provider]  ?insulin glargine (LANTUS) 100 UNIT/ML Solostar Pen Inject 40 Units into the skin at bedtime. 03/18/17   [provider]  ?levofloxacin (LEVAQUIN) 500 MG tablet Take 1 tablet (500 mg total) by mouth daily. 02/28/21   Royston Cowper, MD  ?metFORMIN (GLUCOPHAGE) 1000 MG tablet Take 1,000 mg by mouth 2 (two) times daily with a meal.    [provider]  ?olmesartan (BENICAR) 20 MG tablet Take 20 mg by mouth daily.    [provider]  ?OZEMPIC, 1 MG/DOSE, 4 MG/3ML SOPN Inject  1 mg into the skin every Sunday. 11/20/20   [provider]  ?vitamin B-12 (CYANOCOBALAMIN) 500 MCG tablet Take 500 mcg by mouth daily.    [provider]  ?   ? ?Allergies    ?Codeine and Lisinopril   ? ?Review of Systems   ?Review of Systems  ?All other systems reviewed and are negative. ? ?Physical Exam ?Updated Vital Signs ?BP 138/78 (BP Location: Right Arm)   Pulse 80   Temp 98.5 ?F (36.9 ?C) (Oral)   Resp 17   Ht '5\' 7"'$  (1.702 m)   Wt 93 kg   SpO2 98%   BMI 32.11 kg/m?  ?Physical Exam ?Vitals and nursing note reviewed.  ?Constitutional:   ?   General: He is not in acute distress. ?   Appearance: He is well-developed.  ?HENT:  ?   Head: Normocephalic and atraumatic.  ?Eyes:  ?   Conjunctiva/sclera: Conjunctivae normal.  ?Cardiovascular:  ?   Rate and Rhythm: Normal rate and regular rhythm.  ?   Heart sounds: No murmur heard. ?Pulmonary:  ?   Effort: Pulmonary effort is normal. No respiratory distress.  ?   Breath sounds: Normal breath sounds.  ?Abdominal:  ?   General: There is distension.  ?   Palpations: Abdomen is soft.  ?   Tenderness: There is abdominal tenderness. There is no guarding or rebound.  ?   Comments: Mild bilateral lower abdominal tenderness to palpation  ?  Genitourinary: ?   Testes: Normal.  ?   Comments: No palpable inguinal hernia bilaterally. ?Musculoskeletal:     ?   General: No swelling.  ?   Cervical back: Neck supple.  ?Skin: ?   General: Skin is warm and dry.  ?   Capillary Refill: Capillary refill takes less than 2 seconds.  ?Neurological:  ?   Mental Status: He is alert.  ?Psychiatric:     ?   Mood and Affect: Mood normal.  ? ? ?ED Results / Procedures / Treatments   ?Labs ?(all labs ordered are listed, but only abnormal results are displayed) ?Labs Reviewed  ?COMPREHENSIVE METABOLIC PANEL - Abnormal; Notable for the following components:  ?    Result Value  ? CO2 17 (*)   ? Glucose, Bld 160 (*)   ? Creatinine, Ser 1.34 (*)   ? GFR, Estimated 60 (*)   ? All other  components within normal limits  ?URINALYSIS, ROUTINE W REFLEX MICROSCOPIC - Abnormal; Notable for the following components:  ? Color, Urine STRAW (*)   ? Glucose, UA >=500 (*)   ? All other components within normal limits  ?LIPASE, BLOOD  ?CBC  ?BRAIN NATRIURETIC PEPTIDE  ?LACTIC ACID, PLASMA  ? ? ?EKG ?None ? ?Radiology ?CT ABDOMEN PELVIS W CONTRAST ? ?Result Date: 01/27/2022 ?CLINICAL DATA:  Recurrent right groin pain EXAM: CT ABDOMEN AND PELVIS WITH CONTRAST TECHNIQUE: Multidetector CT imaging of the abdomen and pelvis was performed using the standard protocol following bolus administration of intravenous contrast. RADIATION DOSE REDUCTION: This exam was performed according to the departmental dose-optimization program which includes automated exposure control, adjustment of the mA and/or kV according to patient size and/or use of iterative reconstruction technique. CONTRAST:  152m OMNIPAQUE IOHEXOL 300 MG/ML  SOLN COMPARISON:  No prior CT abdomen, correlation is made with CT pelvis 09/06/2021 FINDINGS: Lower chest: Linear atelectasis and/or scarring. No focal pulmonary opacity or pleural effusion. No pericardial effusion. Hepatobiliary: Calcifications in the liver, likely sequela of prior granulomatous disease. No additional hepatic lesion is seen. No intra or extrahepatic biliary ductal dilatation. The hepatic and portal veins are patent. The gallbladder is unremarkable. Pancreas: Unremarkable. No pancreatic ductal dilatation or surrounding inflammatory changes. Spleen: Normal in size. Calcifications within the spleen, likely sequela of prior granulomatous disease. Adrenals/Urinary Tract: The adrenal glands are unremarkable. Moderate right and mild left hydronephrosis, with right-greater-than-left ureterectasis, and an enlarged, fluid-filled bladder, which is without focal wall abnormality. The kidneys enhance symmetrically. No nephrolithiasis is seen within the kidneys nor within the ureters or bladder.  Stomach/Bowel: Stomach is within normal limits. No evidence of bowel wall thickening, distention, or inflammatory changes. Vascular/Lymphatic: Aortic atherosclerosis. Patent vasculature. No lymphadenopathy. Reproductive: Brachytherapy seeds in the prostate. Other: Redemonstrated moderate left inguinal hernia, which contains fat and a small volume of fluid. Small right inguinal hernia, also fat containing. Musculoskeletal: No acute osseous abnormality. Redemonstrated bone island in the left acetabulum. IMPRESSION: 1. Moderate right and mild left hydronephrosis, with a very distended bladder, concerning for urinary retention and obstruction, which may be secondary to treatment for prostate cancer. 2. Moderate left and small right fat containing inguinal hernias. No evidence of bowel entrapment or obstruction. Electronically Signed   By: AMerilyn BabaM.D.   On: 01/27/2022 12:34   ? ?Procedures ?Procedures  ? ? ?Medications Ordered in ED ?Medications  ?iohexol (OMNIPAQUE) 300 MG/ML solution 100 mL (100 mLs Intravenous Contrast Given 01/27/22 1218)  ? ? ?ED Course/ Medical Decision Making/ A&P ?  ?                        ?  Medical Decision Making ?Amount and/or Complexity of Data Reviewed ?Labs: ordered. ? ?Risk ?Prescription drug management. ? ? ?66 year old male with medical history significant for DM 2, GERD, prostate cancer, chronic left sided inguinal hernia scheduled for surgical repair on 02/20/2022 who presents to the emergency department with a chief complaint of abdominal pain.  The patient states that his hernia normally is reducible and does not cause him significant discomfort.  He states that he recently reduced his hernia and has not recurred.  He is concerned there may be a complication associated with it.  He endorses some generalized bilateral lower abdominal discomfort in addition to abdominal distention.  He denies any nausea or vomiting.  His last bowel movement was last night and was normal.  He states  that he is passing gas.  He does feel more distended than usual. ? ?On arrival, the patient was afebrile, hemodynamically stable, saturating well on room air.  Sinus rhythm noted on cardiac telemetry.  Physical e

## 2022-01-27 NOTE — Discharge Instructions (Addendum)
Your CT abdomen pelvis revealed no evidence of issue related to your hernia.  You have no currently actively herniating bowel at this time.  Your laboratory work-up was reassuring.  You do have evidence of urinary retention both on CT and on exam and had 1 L of residual urine post void on your bladder scan.  Due to this, a Foley catheter has been placed.  Recommend you follow-up outpatient with urology. ?

## 2022-01-27 NOTE — Consult Note (Signed)
Urology Consult  ? ?Reason for consult: urinary retention, difficult foley ? ?History of Present Illness: [name] is a 66 y.o. who presented to the ED earlier today and was found to be in urinary retention. A CT scan was obtained which demonstrated a distended bladder and mild bilateral hydro. The patient was able to void in the ED but still had >1L in his bladder. ED staff attempted foley placement x 2 and was unsuccessful due to marked patient discomfort.  ? ?Mr Climer has a history of brachytherapy for prostate cancer this is managed by Dr Yves Dill in [place].  ? ?Mr Ingrum reports he takes flomax and was recently started on gemtesa for urgency.  ? ?Past Medical History:  ?Diagnosis Date  ? Diabetes mellitus without complication ([place])   ? GERD (gastroesophageal reflux disease)   ? PONV (postoperative nausea and vomiting)   ? Prostate cancer ([place])   ? ? ?Past Surgical History:  ?Procedure Laterality Date  ? HERNIA REPAIR    ? groin as infant  ? NOSE SURGERY    ? PROSTATE BIOPSY N/A 02/28/2021  ? Procedure: PROSTATE BIOPSY URONAV PROSTATE;  Surgeon: Royston Cowper, MD;  Location: [facility] ORS;  Service: Urology;  Laterality: N/A;  ? TOENAIL EXCISION    ? ? ?Current Hospital Medications: ? ?Home Meds:  ?No current facility-administered medications on file prior to encounter.  ? ?Current Outpatient Medications on File Prior to Encounter  ?Medication Sig Dispense Refill  ? aspirin EC 81 MG tablet Take 81 mg by mouth daily. Swallow whole.    ? canagliflozin (INVOKANA) 300 MG TABS tablet Take 300 mg by mouth daily before breakfast.    ? esomeprazole (NEXIUM) 40 MG capsule Take 40 mg by mouth daily at 12 noon.    ? insulin glargine (LANTUS) 100 UNIT/ML Solostar Pen Inject 40 Units into the skin at bedtime.    ? levofloxacin (LEVAQUIN) 500 MG tablet Take 1 tablet (500 mg total) by mouth daily. 7 tablet 1  ? metFORMIN (GLUCOPHAGE) 1000 MG tablet Take 1,000 mg by mouth 2 (two) times daily with a meal.    ? olmesartan  (BENICAR) 20 MG tablet Take 20 mg by mouth daily.    ? OZEMPIC, 1 MG/DOSE, 4 MG/3ML SOPN Inject 1 mg into the skin every Sunday.    ? vitamin B-12 (CYANOCOBALAMIN) 500 MCG tablet Take 500 mcg by mouth daily.    ? ? ? ?Scheduled Meds: ? lidocaine  1 application. Topical Once  ? ?Continuous Infusions: ?PRN Meds:. ? ?Allergies:  ?Allergies  ?Allergen Reactions  ? Codeine   ?  Syncopal Episode ?Patient reports he CAN take "CODONES" WITHOUT any problems  ? Lisinopril Cough  ?   ?  ? ? ?No family history on file. ? ?Social History:  reports that he has never smoked. He has never used smokeless tobacco. He reports that he does not currently use alcohol. He reports that he does not use drugs. ? ?ROS: ?A complete review of systems was performed.  All systems are negative except for pertinent findings as noted. ? ?Physical Exam:  ?Vital signs in last 24 hours: ?Temp:  [98.5 ?F (36.9 ?C)] 98.5 ?F (36.9 ?C) (05/08 1000) ?Pulse Rate:  [70-95] 95 (05/08 1545) ?Resp:  [17-24] 17 (05/08 1545) ?BP: (104-138)/(67-88) 137/82 (05/08 1545) ?SpO2:  [91 %-98 %] 97 % (05/08 1545) ?Weight:  [93 kg] 93 kg (05/08 1125) ?Constitutional:  Alert and oriented, No acute distress ?Cardiovascular: Regular rate and rhythm ?Respiratory: Normal  respiratory effort, Lungs clear bilaterally ?GI: Abdomen is soft, nontender, nondistended, no abdominal masses ?GU: No CVA tenderness ?Neurologic: Grossly intact, no focal deficits ?Psychiatric: Normal mood and affect ? ?Laboratory Data:  ?Recent Labs  ?  01/27/22 ?1027  ?WBC 8.0  ?HGB 13.2  ?HCT 39.6  ?PLT 278  ? ? ?Recent Labs  ?  01/27/22 ?1027  ?NA 139  ?K 3.8  ?CL 109  ?GLUCOSE 160*  ?BUN 19  ?CALCIUM 9.6  ?CREATININE 1.34*  ? ? ? ?Results for orders placed or performed during the hospital encounter of 01/27/22 (from the past 24 hour(s))  ?Lipase, blood     Status: None  ? Collection Time: 01/27/22 10:27 AM  ?Result Value Ref Range  ? Lipase 44 11 - 51 U/L  ?Comprehensive metabolic panel     Status:  Abnormal  ? Collection Time: 01/27/22 10:27 AM  ?Result Value Ref Range  ? Sodium 139 135 - 145 mmol/L  ? Potassium 3.8 3.5 - 5.1 mmol/L  ? Chloride 109 98 - 111 mmol/L  ? CO2 17 (L) 22 - 32 mmol/L  ? Glucose, Bld 160 (H) 70 - 99 mg/dL  ? BUN 19 8 - 23 mg/dL  ? Creatinine, Ser 1.34 (H) 0.61 - 1.24 mg/dL  ? Calcium 9.6 8.9 - 10.3 mg/dL  ? Total Protein 6.9 6.5 - 8.1 g/dL  ? Albumin 3.8 3.5 - 5.0 g/dL  ? AST 16 15 - 41 U/L  ? ALT 12 0 - 44 U/L  ? Alkaline Phosphatase 68 38 - 126 U/L  ? Total Bilirubin 0.7 0.3 - 1.2 mg/dL  ? GFR, Estimated 60 (L) >60 mL/min  ? Anion gap 13 5 - 15  ?CBC     Status: None  ? Collection Time: 01/27/22 10:27 AM  ?Result Value Ref Range  ? WBC 8.0 4.0 - 10.5 K/uL  ? RBC 4.66 4.22 - 5.81 MIL/uL  ? Hemoglobin 13.2 13.0 - 17.0 g/dL  ? HCT 39.6 39.0 - 52.0 %  ? MCV 85.0 80.0 - 100.0 fL  ? MCH 28.3 26.0 - 34.0 pg  ? MCHC 33.3 30.0 - 36.0 g/dL  ? RDW 13.0 11.5 - 15.5 %  ? Platelets 278 150 - 400 K/uL  ? nRBC 0.0 0.0 - 0.2 %  ?Brain natriuretic peptide     Status: None  ? Collection Time: 01/27/22 10:27 AM  ?Result Value Ref Range  ? B Natriuretic Peptide 27.4 0.0 - 100.0 pg/mL  ?Lactic acid, plasma     Status: None  ? Collection Time: 01/27/22 11:39 AM  ?Result Value Ref Range  ? Lactic Acid, Venous 1.8 0.5 - 1.9 mmol/L  ?Urinalysis, Routine w reflex microscopic     Status: Abnormal  ? Collection Time: 01/27/22 12:16 PM  ?Result Value Ref Range  ? Color, Urine STRAW (A) YELLOW  ? APPearance CLEAR CLEAR  ? Specific Gravity, Urine 1.017 1.005 - 1.030  ? pH 5.0 5.0 - 8.0  ? Glucose, UA >=500 (A) NEGATIVE mg/dL  ? Hgb urine dipstick NEGATIVE NEGATIVE  ? Bilirubin Urine NEGATIVE NEGATIVE  ? Ketones, ur NEGATIVE NEGATIVE mg/dL  ? Protein, ur NEGATIVE NEGATIVE mg/dL  ? Nitrite NEGATIVE NEGATIVE  ? Leukocytes,Ua NEGATIVE NEGATIVE  ? WBC, UA 0-5 0 - 5 WBC/hpf  ? Bacteria, UA NONE SEEN NONE SEEN  ? ?No results found for this or any previous visit (from the past 240 hour(s)). ? ?Renal Function: ?Recent Labs   ?  01/23/22 ?1557 01/27/22 ?1027  ?  CREATININE 1.35* 1.34*  ? ?Estimated Creatinine Clearance: 61.4 mL/min (A) (by C-G formula based on SCr of 1.34 mg/dL (H)). ? ?Radiologic Imaging: ?CT ABDOMEN PELVIS W CONTRAST ? ?Result Date: 01/27/2022 ?CLINICAL DATA:  Recurrent right groin pain EXAM: CT ABDOMEN AND PELVIS WITH CONTRAST TECHNIQUE: Multidetector CT imaging of the abdomen and pelvis was performed using the standard protocol following bolus administration of intravenous contrast. RADIATION DOSE REDUCTION: This exam was performed according to the departmental dose-optimization program which includes automated exposure control, adjustment of the mA and/or kV according to patient size and/or use of iterative reconstruction technique. CONTRAST:  158m OMNIPAQUE IOHEXOL 300 MG/ML  SOLN COMPARISON:  No prior CT abdomen, correlation is made with CT pelvis 09/06/2021 FINDINGS: Lower chest: Linear atelectasis and/or scarring. No focal pulmonary opacity or pleural effusion. No pericardial effusion. Hepatobiliary: Calcifications in the liver, likely sequela of prior granulomatous disease. No additional hepatic lesion is seen. No intra or extrahepatic biliary ductal dilatation. The hepatic and portal veins are patent. The gallbladder is unremarkable. Pancreas: Unremarkable. No pancreatic ductal dilatation or surrounding inflammatory changes. Spleen: Normal in size. Calcifications within the spleen, likely sequela of prior granulomatous disease. Adrenals/Urinary Tract: The adrenal glands are unremarkable. Moderate right and mild left hydronephrosis, with right-greater-than-left ureterectasis, and an enlarged, fluid-filled bladder, which is without focal wall abnormality. The kidneys enhance symmetrically. No nephrolithiasis is seen within the kidneys nor within the ureters or bladder. Stomach/Bowel: Stomach is within normal limits. No evidence of bowel wall thickening, distention, or inflammatory changes. Vascular/Lymphatic:  Aortic atherosclerosis. Patent vasculature. No lymphadenopathy. Reproductive: Brachytherapy seeds in the prostate. Other: Redemonstrated moderate left inguinal hernia, which contains fat and a small volume of f

## 2022-01-27 NOTE — ED Notes (Signed)
Patient transported to CT 

## 2022-02-12 ENCOUNTER — Other Ambulatory Visit: Payer: BC Managed Care – PPO

## 2022-02-20 ENCOUNTER — Ambulatory Visit: Admit: 2022-02-20 | Payer: BC Managed Care – PPO | Admitting: Urology

## 2022-02-20 SURGERY — REPAIR, HERNIA, INGUINAL, ADULT
Anesthesia: Choice | Laterality: Left

## 2022-05-14 ENCOUNTER — Ambulatory Visit: Payer: Self-pay | Admitting: Neurology

## 2022-05-22 ENCOUNTER — Ambulatory Visit: Payer: BC Managed Care – PPO | Admitting: Neurology

## 2022-05-22 ENCOUNTER — Telehealth: Payer: Self-pay | Admitting: Neurology

## 2022-05-22 ENCOUNTER — Other Ambulatory Visit: Payer: Self-pay | Admitting: Neurology

## 2022-05-22 ENCOUNTER — Encounter: Payer: Self-pay | Admitting: Neurology

## 2022-05-22 VITALS — Ht 67.0 in | Wt 212.0 lb

## 2022-05-22 DIAGNOSIS — C61 Malignant neoplasm of prostate: Secondary | ICD-10-CM | POA: Insufficient documentation

## 2022-05-22 DIAGNOSIS — E559 Vitamin D deficiency, unspecified: Secondary | ICD-10-CM | POA: Diagnosis not present

## 2022-05-22 DIAGNOSIS — R413 Other amnesia: Secondary | ICD-10-CM | POA: Diagnosis not present

## 2022-05-22 DIAGNOSIS — G4733 Obstructive sleep apnea (adult) (pediatric): Secondary | ICD-10-CM

## 2022-05-22 DIAGNOSIS — F418 Other specified anxiety disorders: Secondary | ICD-10-CM

## 2022-05-22 MED ORDER — CITALOPRAM HYDROBROMIDE 40 MG PO TABS
40.0000 mg | ORAL_TABLET | Freq: Every day | ORAL | 5 refills | Status: DC
Start: 2022-05-22 — End: 2022-06-16

## 2022-05-22 MED ORDER — ARIPIPRAZOLE 5 MG PO TABS: 5.0000 mg | ORAL_TABLET | Freq: Every day | ORAL | 5 refills | Status: DC

## 2022-05-22 NOTE — Telephone Encounter (Signed)
Please call pt and offer 08/04/22 at 2pm with Dr. Felecia Shelling instead

## 2022-05-22 NOTE — Progress Notes (Signed)
[facility]  PATIENT: [name] DOB: 04/02/1956  REFERRING DOCTOR OR PCP: Vedia Coffer, PA-C SOURCE: Patient, notes from primary care  _________________________________   HISTORICAL  CHIEF COMPLAINT:  Chief Complaint  Patient presents with   New Patient (Initial Visit)    Pt with wife rm 1. Presents today with ongoing memory concerns that they feel may be r/t to chemo treatment. Dementia does run in the family. 21/30 on MMSE in the practice. an    HISTORY OF PRESENT ILLNESS:  I had the pleasure of seeing your patient, [name], at [facility] for neurologic consultation regarding his memory concerns.  He is a 66 yo man who has had difficulty with short term memory since diagnosed with prostate cancer in 2022.  He felt that he worsened after his initial Lupron in 2022 and more issues after his second round early 2023.      He feels his cognitive issues are only memory related and he denies difficulty with visual-spatial tasks, executive function or language.  When he has difficulty remembering something, he usually will recall with hints.     He has no difficulty doing chores, even complex engine work on a car.     His wife feels he is much more anxious and depressed since the prostate cancer issues.   He has been very tearful at times   He is apathetic.  He  has been on citalopram 20 mg po daily x one year and buspirone 5 mg po qd (was bid but became sleepy).   .   He also has had more depression since ED issues related to the PSA and low testosterone.        Viagra caused HA and is reluctant to do injections for ED.      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    He has had some financial issues due to being out of work.  At his last PCP visit, an MMSE was 21/30.  Today he scored 10/30 on the MoCA.      Physically he is doing well.  He denies gait issues though balance is  sometimes off..   No numbness or weakness though his hands feel stiff.    He snores and has witnessed OSA.   He had a hone sleep study 2 nights ago but results have not posted.   He has HTN, NIDDM, hyperlipidemia,       05/22/2022    2:13 PM  Montreal Cognitive Assessment   Visuospatial/ Executive (0/5) 1  Naming (0/3) 3  Attention: Read list of digits (0/2) 1  Attention: Read list of letters (0/1) 0  Attention: Serial 7 subtraction starting at 100 (0/3) 0  Language: Repeat phrase (0/2) 0  Language : Fluency (0/1) 0  Abstraction (0/2) 2  Delayed Recall (0/5) 1  Orientation (0/6) 2  Total 10   Imaging personally reviewed: CT head 01/23/2022 showed minimal atrophy and no acute findings.   Some calcification in left vertebral artery.      Laboratory results: 04/22/2022 lipid panel showed very elevated triglycerides of 464 but normal cholesterol.  CMP showed elevated glucose and elevated creatinine (1.6) the estimated GFR was 47.  Hemoglobin A1c was 7.7 mild anemia with hemoglobin 11.7.  02/24/2022 testosterone level less than 20 02/20/2022: TSH was normal  REVIEW OF SYSTEMS: Constitutional: No fevers, chills, sweats, or change in appetite Eyes: No visual changes,  double vision, eye pain Ear, nose and throat: No hearing loss, ear pain, nasal congestion, sore throat Cardiovascular: No chest pain, palpitations Respiratory:  No shortness of breath at rest or with exertion.   No wheezes GastrointestinaI: No nausea, vomiting, diarrhea, abdominal pain, fecal incontinence Genitourinary:  No dysuria, urinary retention or frequency.  No nocturia. Musculoskeletal:  No neck pain, back pain Integumentary: No rash, pruritus, skin lesions Neurological: as above Psychiatric: No depression at this time.  No anxiety Endocrine: No palpitations, diaphoresis, change in appetite, change in weigh or increased thirst Hematologic/Lymphatic:  No anemia, purpura, petechiae. Allergic/Immunologic: No itchy/runny  eyes, nasal congestion, recent allergic reactions, rashes  ALLERGIES: Allergies  Allergen Reactions   Codeine     Syncopal Episode Patient reports he CAN take "CODONES" WITHOUT any problems   Lisinopril Cough         HOME MEDICATIONS:  Current Outpatient Medications:    ARIPiprazole (ABILIFY) 5 MG tablet, Take 1 tablet (5 mg total) by mouth daily., Disp: 30 tablet, Rfl: 5   aspirin EC 81 MG tablet, Take 81 mg by mouth daily. Swallow whole., Disp: , Rfl:    busPIRone (BUSPAR) 5 MG tablet, Take 5 mg by mouth daily., Disp: , Rfl:    cyanocobalamin (VITAMIN B12) 1000 MCG tablet, Take 1 tablet by mouth daily., Disp: , Rfl:    Diclofenac Sodium CR 100 MG 24 hr tablet, Take 100 mg by mouth daily., Disp: , Rfl:    esomeprazole (NEXIUM) 40 MG capsule, Take 40 mg by mouth daily at 12 noon., Disp: , Rfl:    furosemide (LASIX) 40 MG tablet, Take 20 mg by mouth daily., Disp: , Rfl:    insulin glargine (LANTUS) 100 UNIT/ML Solostar Pen, Inject 40 Units into the skin at bedtime., Disp: , Rfl:    insulin lispro (HUMALOG) 200 UNIT/ML KwikPen, Use 3 times daily as directed. Max daily dose is 100 units., Disp: , Rfl:    levofloxacin (LEVAQUIN) 500 MG tablet, Take 1 tablet (500 mg total) by mouth daily., Disp: 7 tablet, Rfl: 1   megestrol (MEGACE) 20 MG tablet, Take 20 mg by mouth daily., Disp: , Rfl:    metFORMIN (GLUCOPHAGE) 1000 MG tablet, Take 1,000 mg by mouth 2 (two) times daily with a meal., Disp: , Rfl:    olmesartan (BENICAR) 20 MG tablet, Take 20 mg by mouth daily., Disp: , Rfl:    ondansetron (ZOFRAN) 4 MG tablet, Take by mouth., Disp: , Rfl:    rosuvastatin (CRESTOR) 40 MG tablet, Take 40 mg by mouth daily., Disp: , Rfl:    tamsulosin (FLOMAX) 0.4 MG CAPS capsule, TAKE 1 CAPSULE BY MOUTH DAILY 1 HOUR AFTER THE LAST MEAL OF THE DAY, Disp: , Rfl:    citalopram (CELEXA) 40 MG tablet, Take 1 tablet (40 mg total) by mouth daily., Disp: 30 tablet, Rfl: 5  PAST MEDICAL HISTORY: Past Medical  History:  Diagnosis Date   Diabetes mellitus without complication (HCC)    GERD (gastroesophageal reflux disease)    PONV (postoperative nausea and vomiting)    Prostate cancer ([place])     PAST SURGICAL HISTORY: Past Surgical History:  Procedure Laterality Date   HERNIA REPAIR     groin as infant   NOSE SURGERY     PROSTATE BIOPSY N/A 02/28/2021   Procedure: PROSTATE BIOPSY URONAV PROSTATE;  Surgeon: Royston Cowper, MD;  Location: [facility] ORS;  Service: Urology;  Laterality: N/A;   TOENAIL EXCISION      FAMILY HISTORY: No  family history on file.  SOCIAL HISTORY:  Social History   Socioeconomic History   Marital status: Married    Spouse name: Not on file   Number of children: Not on file   Years of education: Not on file   Highest education level: Not on file  Occupational History   Not on file  Tobacco Use   Smoking status: Never   Smokeless tobacco: Never  Vaping Use   Vaping Use: Never used  Substance and Sexual Activity   Alcohol use: Not Currently   Drug use: Never   Sexual activity: Not on file  Other Topics Concern   Not on file  Social History Narrative   Not on file   Social Determinants of Health   Financial Resource Strain: Not on file  Food Insecurity: Not on file  Transportation Needs: Not on file  Physical Activity: Not on file  Stress: Not on file  Social Connections: Not on file  Intimate Partner Violence: Not on file     PHYSICAL EXAM  Vitals:   05/22/22 1402  Weight: 212 lb (96.2 kg)  Height: '5\' 7"'$  (1.702 m)    Body mass index is 33.2 kg/m.   General: The patient is well-developed and well-nourished and in no acute distress  HEENT:  Head is [place]/AT.  Sclera are anicteric.  Funduscopic exam shows normal optic discs and retinal vessels.  Neck: No carotid bruits are noted.  The neck is nontender.  Cardiovascular: The heart has a regular rate and rhythm with a normal S1 and S2. There were no murmurs, gallops or rubs.    Skin:  Extremities are without rash or  edema.  Musculoskeletal:  Back is nontender  Neurologic Exam  Mental status: He scored 10/30 on the [facility] cognitive assessment.  However, his interaction with me suggested a higher score.   Speech is normal.  Cranial nerves: Extraocular movements are full. Pupils are equal, round, and reactive to light and accomodation.  Visual fields are full.  Facial symmetry is present. There is good facial sensation to soft touch bilaterally.Facial strength is normal.  Trapezius and sternocleidomastoid strength is normal. No dysarthria is noted.  The tongue is midline, and the patient has symmetric elevation of the soft palate. No obvious hearing deficits are noted.  Motor:  Muscle bulk is normal.   Tone is normal. Strength is  5 / 5 in all 4 extremities.   Sensory: Sensory testing is intact to pinprick, soft touch and vibration sensation in all 4 extremities.  Coordination: Cerebellar testing reveals good finger-nose-finger and heel-to-shin bilaterally.  Gait and station: Station is normal.   Gait is normal. Tandem gait is normal. Romberg is negative.   Reflexes: Deep tendon reflexes are symmetric and normal bilaterally.   Plantar responses are flexor.    DIAGNOSTIC DATA (LABS, IMAGING, TESTING) - I reviewed patient records, labs, notes, testing and imaging myself where available.  Lab Results  Component Value Date   WBC 8.0 01/27/2022   HGB 13.2 01/27/2022   HCT 39.6 01/27/2022   MCV 85.0 01/27/2022   PLT 278 01/27/2022      Component Value Date/Time   NA 139 01/27/2022 1027   K 3.8 01/27/2022 1027   CL 109 01/27/2022 1027   CO2 17 (L) 01/27/2022 1027   GLUCOSE 160 (H) 01/27/2022 1027   BUN 19 01/27/2022 1027   CREATININE 1.34 (H) 01/27/2022 1027   CALCIUM 9.6 01/27/2022 1027   PROT 6.9 01/27/2022 1027   ALBUMIN 3.8  01/27/2022 1027   AST 16 01/27/2022 1027   ALT 12 01/27/2022 1027   ALKPHOS 68 01/27/2022 1027   BILITOT 0.7 01/27/2022 1027    GFRNONAA 60 (L) 01/27/2022 1027       ASSESSMENT AND PLAN  Memory loss - Plan: Vitamin B12  Vitamin D deficiency - Plan: VITAMIN D 25 Hydroxy (Vit-D Deficiency, Fractures)  Depression with anxiety  Prostate CA (HCC)  OSA (obstructive sleep apnea)  In summary, Mr. Atienza is a 66 year old man with memory loss that started after his diagnosis of prostate cancer and worsened during treatment.  He has had depression and anxiety over the last year and I believe that that is playing some role in his cognitive impairment.  Additionally, he may have sleep apnea which could also be playing a role.  I cannot rule out that there is a neurodegenerative process but the CT scan did not show inappropriate atrophy.  Therefore, I we will have him increase the citalopram from 20 mg to 40 mg and add low-dose Abilify as an adjuvant to help with the agitation/anxiety.  Additionally, he has a home sleep study pending [facility]) and should get those results soon.  I advised him to go on CPAP if he does indeed have sleep apnea as treatment may also help memory.  If he does not resolve after couple months of increased therapy for the depression and CPAP (if indicated) then I will check an MRI of the brain without contrast to further evaluate to help rule out more subtle findings that would not have shown up on the CT scan such as vascular dementia.  He will return to see me in 2-3 months or sooner if there are new or worsening neurologic symptoms.   Demeisha Geraghty A. Felecia Shelling, MD, [facility] 7/37/1062, 6:94 PM Certified in Neurology, Clinical Neurophysiology, Sleep Medicine and Neuroimaging  [facility] [address], [place], [place] [address] [phone]

## 2022-05-22 NOTE — Telephone Encounter (Signed)
Pt was scheduled for 1/24 @ 3:30pm. Dr Felecia Shelling wanted to see Pt back in 2 mos but nothing was available.

## 2022-05-22 NOTE — Telephone Encounter (Signed)
Called Pt and offer 11/13 @ 2pm. Pt accepted appointment

## 2022-05-23 LAB — VITAMIN B12: Vitamin B-12: 821 pg/mL (ref 232–1245)

## 2022-05-23 LAB — VITAMIN D 25 HYDROXY (VIT D DEFICIENCY, FRACTURES): Vit D, 25-Hydroxy: 18.3 ng/mL — ABNORMAL LOW (ref 30.0–100.0)

## 2022-05-27 ENCOUNTER — Encounter: Payer: Self-pay | Admitting: Neurology

## 2022-05-27 ENCOUNTER — Other Ambulatory Visit: Payer: Self-pay | Admitting: *Deleted

## 2022-05-27 DIAGNOSIS — R7989 Other specified abnormal findings of blood chemistry: Secondary | ICD-10-CM

## 2022-05-27 MED ORDER — VITAMIN D (ERGOCALCIFEROL) 1.25 MG (50000 UNIT) PO CAPS: 50000.0000 [IU] | ORAL_CAPSULE | ORAL | 1 refills | Status: AC

## 2022-05-29 ENCOUNTER — Encounter: Payer: Self-pay | Admitting: Neurology

## 2022-06-13 ENCOUNTER — Other Ambulatory Visit: Payer: Self-pay | Admitting: Neurology

## 2022-06-25 ENCOUNTER — Ambulatory Visit: Payer: BC Managed Care – PPO | Admitting: Neurology

## 2022-07-17 ENCOUNTER — Ambulatory Visit: Payer: Self-pay | Admitting: Neurology

## 2022-08-04 ENCOUNTER — Ambulatory Visit: Payer: BC Managed Care – PPO | Admitting: Neurology

## 2022-08-05 ENCOUNTER — Ambulatory Visit (INDEPENDENT_AMBULATORY_CARE_PROVIDER_SITE_OTHER): Payer: BC Managed Care – PPO | Admitting: Neurology

## 2022-08-05 ENCOUNTER — Encounter: Payer: Self-pay | Admitting: Neurology

## 2022-08-05 VITALS — BP 142/79 | HR 93 | Ht 67.0 in | Wt 216.0 lb

## 2022-08-05 DIAGNOSIS — G3184 Mild cognitive impairment, so stated: Secondary | ICD-10-CM

## 2022-08-05 DIAGNOSIS — G4733 Obstructive sleep apnea (adult) (pediatric): Secondary | ICD-10-CM

## 2022-08-05 DIAGNOSIS — F418 Other specified anxiety disorders: Secondary | ICD-10-CM

## 2022-08-05 DIAGNOSIS — R413 Other amnesia: Secondary | ICD-10-CM | POA: Diagnosis not present

## 2022-08-05 MED ORDER — BUSPIRONE HCL 15 MG PO TABS: 15.0000 mg | ORAL_TABLET | Freq: Two times a day (BID) | ORAL | 11 refills | Status: DC

## 2022-08-05 NOTE — Progress Notes (Signed)
[facility]  PATIENT: [name] DOB: 02/23/1956  REFERRING DOCTOR OR PCP: Vedia Coffer, PA-C SOURCE: Patient, notes from primary care  _________________________________   HISTORICAL  CHIEF COMPLAINT:  Chief Complaint  Patient presents with   Follow-up    Pt in room #1 with his wife. Pt here today for f/u with memory loss.    HISTORY OF PRESENT ILLNESS:  [name] is a 66 y.o. man with memory concerns.  Update 08/05/2022 He continues to note mild memory difficulties and cognitive issues but feels he is better than at the last visit.  SInce the last visit, he was started on CPAP and feels more awake and alert.   He still has some reduced memory but feel he is doing better.   He also has anxiety/depression.   I had added Abilfy but he felt worse and stopped.    He is on Celexa 40 mg daily and buspirone just 5 mg po daily.  He feels his mood is doing okay but his wife notes he still has anxiety.  When more anxious, his tremor is worse.  Memory history: He has had difficulty with short term memory since diagnosed with prostate cancer in 2022.  He felt that he worsened after his initial Lupron in 2022 and more issues after his second round early 2023.      He feels his cognitive issues are only memory related and he denies difficulty with visual-spatial tasks, executive function or language.  When he has difficulty remembering something, he usually will recall with hints.     He has no difficulty doing chores, even complex engine work on a car.     His wife noted he was more  anxious and depressed since the prostate cancer issues.   He has been very tearful at times   He is apathetic.  He  has been on citalopram 20 mg po daily x one year and buspirone 5 mg po qd (was bid but became sleepy).   .   He also has had more depression since ED issues related to the PSA and low testosterone.        Viagra caused HA and is reluctant to do injections for ED.       At his visit with primary care in mid 2023, he had an MMSE was 21/30.  In September 2023,  he scored 10/30 on the MoCA in our office.      Physically he is doing well.  He denies gait issues though balance is sometimes off..   No numbness or weakness though his hands feel stiff.    He snores and has witnessed OSA.   He had a home sleep study 05/20/2022 showing moderate OSA and CPAP was initiated.  He feels more awake and alert on CPAP.Marland Kitchen  No REM behavior disorder.  No insomnia.     He has HTN, NIDDM, hyperlipidemia,      08/05/2022    3:58 PM  MMSE - Mini Mental State Exam  Orientation to time 4  Orientation to Place 5  Registration 3  Attention/ Calculation 1  Recall 3  Language- name 2 objects 2  Language- repeat 1  Language- follow 3 step command 3  Language- read & follow direction 1  Write a sentence 1  Copy design 0  Total score 24        05/22/2022    2:13 PM  Montreal Cognitive Assessment   Visuospatial/ Executive (0/5) 1  Naming (0/3)  3  Attention: Read list of digits (0/2) 1  Attention: Read list of letters (0/1) 0  Attention: Serial 7 subtraction starting at 100 (0/3) 0  Language: Repeat phrase (0/2) 0  Language : Fluency (0/1) 0  Abstraction (0/2) 2  Delayed Recall (0/5) 1  Orientation (0/6) 2  Total 10   Imaging personally reviewed: CT head 01/23/2022 showed minimal atrophy and no acute findings.   Some calcification in left vertebral artery.      Laboratory results: 04/22/2022 lipid panel showed very elevated triglycerides of 464 but normal cholesterol.  CMP showed elevated glucose and elevated creatinine (1.6) the estimated GFR was 47.  Hemoglobin A1c was 7.7 mild anemia with hemoglobin 11.7.  02/24/2022 testosterone level less than 20 02/20/2022: TSH was normal  REVIEW OF SYSTEMS: Constitutional: No fevers, chills, sweats, or change in appetite Eyes: No visual changes, double vision, eye pain Ear, nose and throat: No hearing loss, ear pain, nasal  congestion, sore throat Cardiovascular: No chest pain, palpitations Respiratory:  No shortness of breath at rest or with exertion.   No wheezes GastrointestinaI: No nausea, vomiting, diarrhea, abdominal pain, fecal incontinence Genitourinary:  No dysuria, urinary retention or frequency.  No nocturia. Musculoskeletal:  No neck pain, back pain Integumentary: No rash, pruritus, skin lesions Neurological: as above Psychiatric: No depression at this time.  No anxiety Endocrine: No palpitations, diaphoresis, change in appetite, change in weigh or increased thirst Hematologic/Lymphatic:  No anemia, purpura, petechiae. Allergic/Immunologic: No itchy/runny eyes, nasal congestion, recent allergic reactions, rashes  ALLERGIES: Allergies  Allergen Reactions   Codeine     Syncopal Episode Patient reports he CAN take "CODONES" WITHOUT any problems   Lisinopril Cough         HOME MEDICATIONS:  Current Outpatient Medications:    ARIPiprazole (ABILIFY) 5 MG tablet, TAKE 1 TABLET (5 MG TOTAL) BY MOUTH DAILY., Disp: 90 tablet, Rfl: 2   aspirin EC 81 MG tablet, Take 81 mg by mouth daily. Swallow whole., Disp: , Rfl:    citalopram (CELEXA) 40 MG tablet, TAKE 1 TABLET BY MOUTH EVERY DAY, Disp: 90 tablet, Rfl: 2   cyanocobalamin (VITAMIN B12) 1000 MCG tablet, Take 1 tablet by mouth daily., Disp: , Rfl:    Diclofenac Sodium CR 100 MG 24 hr tablet, Take 100 mg by mouth daily., Disp: , Rfl:    esomeprazole (NEXIUM) 40 MG capsule, Take 40 mg by mouth daily at 12 noon., Disp: , Rfl:    furosemide (LASIX) 40 MG tablet, Take 20 mg by mouth daily., Disp: , Rfl:    insulin glargine (LANTUS) 100 UNIT/ML Solostar Pen, Inject 40 Units into the skin at bedtime., Disp: , Rfl:    insulin lispro (HUMALOG) 200 UNIT/ML KwikPen, Use 3 times daily as directed. Max daily dose is 100 units., Disp: , Rfl:    levofloxacin (LEVAQUIN) 500 MG tablet, Take 1 tablet (500 mg total) by mouth daily., Disp: 7 tablet, Rfl: 1    megestrol (MEGACE) 20 MG tablet, Take 20 mg by mouth daily., Disp: , Rfl:    metFORMIN (GLUCOPHAGE) 1000 MG tablet, Take 1,000 mg by mouth 2 (two) times daily with a meal., Disp: , Rfl:    olmesartan (BENICAR) 20 MG tablet, Take 20 mg by mouth daily., Disp: , Rfl:    ondansetron (ZOFRAN) 4 MG tablet, Take by mouth., Disp: , Rfl:    rosuvastatin (CRESTOR) 40 MG tablet, Take 40 mg by mouth daily., Disp: , Rfl:    tamsulosin (FLOMAX) 0.4 MG CAPS  capsule, TAKE 1 CAPSULE BY MOUTH DAILY 1 HOUR AFTER THE LAST MEAL OF THE DAY, Disp: , Rfl:    Vitamin D, Ergocalciferol, (DRISDOL) 1.25 MG (50000 UNIT) CAPS capsule, Take 1 capsule (50,000 Units total) by mouth every 7 (seven) days., Disp: 13 capsule, Rfl: 1   busPIRone (BUSPAR) 15 MG tablet, Take 1 tablet (15 mg total) by mouth 2 (two) times daily., Disp: 60 tablet, Rfl: 11  PAST MEDICAL HISTORY: Past Medical History:  Diagnosis Date   Diabetes mellitus without complication (HCC)    GERD (gastroesophageal reflux disease)    PONV (postoperative nausea and vomiting)    Prostate cancer ([place])     PAST SURGICAL HISTORY: Past Surgical History:  Procedure Laterality Date   HERNIA REPAIR     groin as infant   NOSE SURGERY     PROSTATE BIOPSY N/A 02/28/2021   Procedure: PROSTATE BIOPSY URONAV PROSTATE;  Surgeon: Royston Cowper, MD;  Location: [facility] ORS;  Service: Urology;  Laterality: N/A;   TOENAIL EXCISION      FAMILY HISTORY: History reviewed. No pertinent family history.  SOCIAL HISTORY:  Social History   Socioeconomic History   Marital status: Married    Spouse name: Not on file   Number of children: Not on file   Years of education: Not on file   Highest education level: Not on file  Occupational History   Not on file  Tobacco Use   Smoking status: Never   Smokeless tobacco: Never  Vaping Use   Vaping Use: Never used  Substance and Sexual Activity   Alcohol use: Not Currently   Drug use: Never   Sexual activity: Not on file   Other Topics Concern   Not on file  Social History Narrative   Not on file   Social Determinants of Health   Financial Resource Strain: Not on file  Food Insecurity: Not on file  Transportation Needs: Not on file  Physical Activity: Not on file  Stress: Not on file  Social Connections: Not on file  Intimate Partner Violence: Not on file     PHYSICAL EXAM  Vitals:   08/05/22 1534  BP: (!) 142/79  Pulse: 93  Weight: 216 lb (98 kg)  Height: '5\' 7"'$  (1.702 m)    Body mass index is 33.83 kg/m.   General: The patient is well-developed and well-nourished and in no acute distress  HEENT:  Head is [place]/AT.  Sclera are anicteric.     Skin: Extremities are without rash or  edema.  Musculoskeletal:  Back is nontender  Neurologic Exam  Mental status: He scored 24/30 on the [facility] cognitive assessment (mild cognitive impairment) speech is normal.  Cranial nerves: Extraocular movements are full.  Facial strength and sensation was normal.  No obvious hearing deficits are noted.  Motor: Slight intention tremor in hands.  Muscle bulk is normal.  No cogwheeling.  Tone is normal. Strength is  5 / 5 in all 4 extremities.   Sensory: Sensory testing is intact to pinprick, soft touch and vibration sensation in all 4 extremities.  Coordination: Cerebellar testing reveals good finger-nose-finger and heel-to-shin bilaterally.  Gait and station: Station is normal.   Gait is fairly normal.  Tandem gait is mildly wide.  He does not not have retropulsion.. Romberg is negative.   Reflexes: Deep tendon reflexes are symmetric and normal bilaterally.      DIAGNOSTIC DATA (LABS, IMAGING, TESTING) - I reviewed patient records, labs, notes, testing and imaging myself where available.  Lab Results  Component Value Date   WBC 8.0 01/27/2022   HGB 13.2 01/27/2022   HCT 39.6 01/27/2022   MCV 85.0 01/27/2022   PLT 278 01/27/2022      Component Value Date/Time   NA 139 01/27/2022 1027   K  3.8 01/27/2022 1027   CL 109 01/27/2022 1027   CO2 17 (L) 01/27/2022 1027   GLUCOSE 160 (H) 01/27/2022 1027   BUN 19 01/27/2022 1027   CREATININE 1.34 (H) 01/27/2022 1027   CALCIUM 9.6 01/27/2022 1027   PROT 6.9 01/27/2022 1027   ALBUMIN 3.8 01/27/2022 1027   AST 16 01/27/2022 1027   ALT 12 01/27/2022 1027   ALKPHOS 68 01/27/2022 1027   BILITOT 0.7 01/27/2022 1027   GFRNONAA 60 (L) 01/27/2022 1027       ASSESSMENT AND PLAN  Memory loss  Mild cognitive impairment  OSA (obstructive sleep apnea)  Depression with anxiety  He has mild cognitive impairment, improved compared to testing at the last visit.  It is possible that mood issues or worsening his cognition.  I will increase his BuSpar to 15 mg p.o. twice daily.  If that does not help his anxiety/depression further it may be good to refer him to psychiatry.  If memory does not improve further, also consider neurocognitive testing with neuropsychology.   He is advised to continue CPAP. 3.   Return in 1 year or sooner if there are new or worsening neurologic symptoms.  Juleah Paradise A. Felecia Shelling, MD, [facility] 70/62/3762, 8:31 PM Certified in Neurology, Clinical Neurophysiology, Sleep Medicine and Neuroimaging  [facility] [address] [place], [place] [address] [phone]

## 2022-08-21 ENCOUNTER — Telehealth: Payer: Self-pay | Admitting: Neurology

## 2022-08-21 NOTE — Telephone Encounter (Signed)
Called wife back to discuss. States Chemo has altered pt. Also had hernia surgery 5 wk ago. Surgeon filled out forms and put on there he is able to return to work tomorrow.   However, wife does not feel he can return to work with ongoing memory issues. PCP feels Dr. Garth Bigness needs to be the one to extend pt disability. They have to have an ending date on form. Wife feels he needs to be out past Christmas. Yesterday, he was trying to help her with things around the house. Asked what he could do. She asked him to clean his bathroom. 15-20 min later, he came downstairs. He forgot that she asked him to clean bathroom. Forgetting things within 2-3 min. States husband walking very slow.  Feels buspar has helped w/ anxiety.   Forms need to be turned in by 09/05/22. She will fax forms to [place] at [phone].

## 2022-08-21 NOTE — Telephone Encounter (Signed)
Pt's wife called stating that [employer] will be sending over some forms over that need to be filled out. She would like for the RN or MD to call back to discuss the situation.

## 2022-08-25 NOTE — Telephone Encounter (Signed)
Wife has called to report that for whatever the reason Disability has sent the paperwork to another provider of the pt's, wife is unsure as to why that was done.  Wife states there is nothing Dr Garth Bigness office needs to do

## 2022-08-25 NOTE — Telephone Encounter (Signed)
Noted  

## 2022-08-27 ENCOUNTER — Other Ambulatory Visit: Payer: Self-pay | Admitting: Neurology

## 2022-09-20 IMAGING — CT CT HEAD W/O CM
4 series · 16 of 47 positions shown, 18 images · non-contrast
Comparison: None Available.

CLINICAL DATA: Altered mental status, nontraumatic



[Series 3: head without · axial · non-contrast · 0.42mm/px · z∈[-52,+68]mm · 7 of 33 slices shown, 9 images]
[im 5/33  brain]
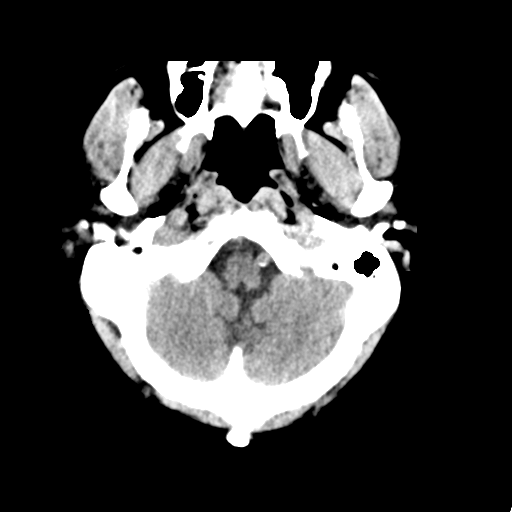
[im 5/33  bone]
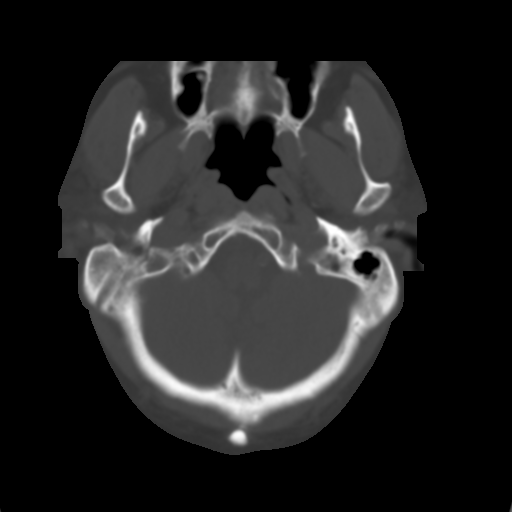
[im 9/33  brain]
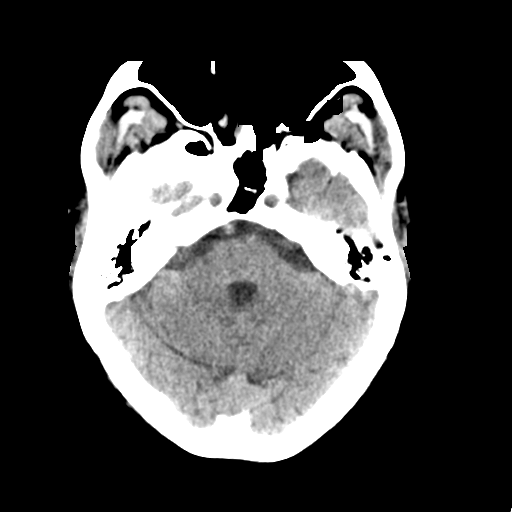
[im 13/33  brain]
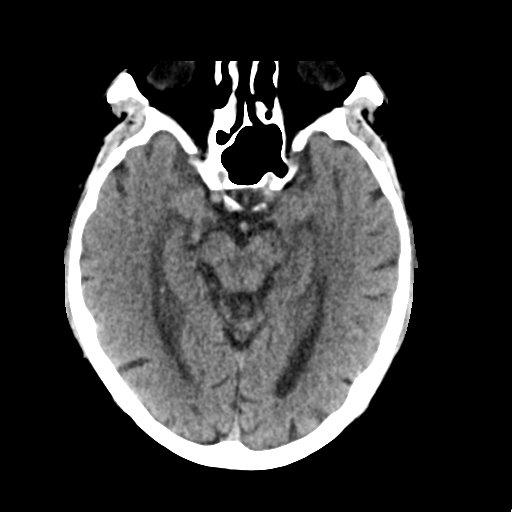
[im 17/33  brain]
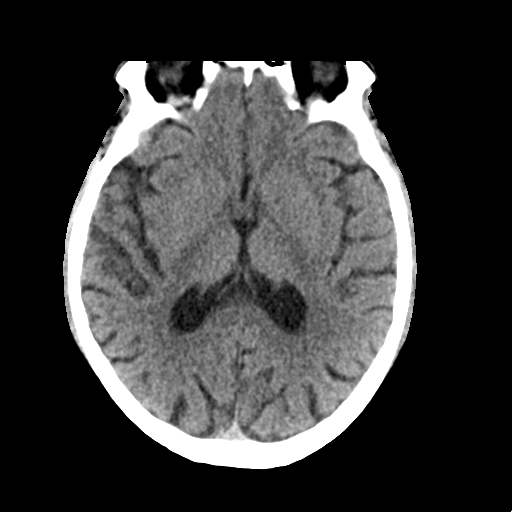
[im 21/33  brain]
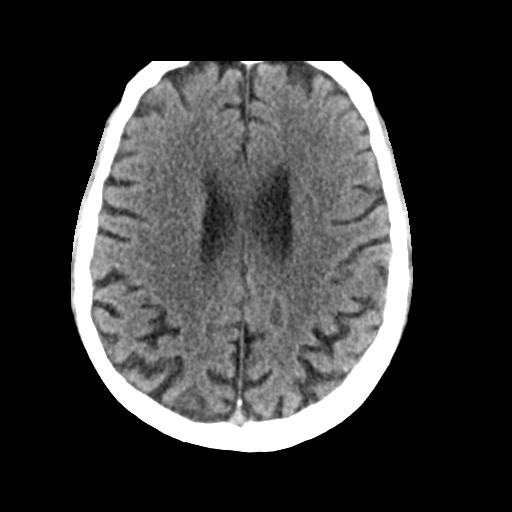
[im 21/33  bone]
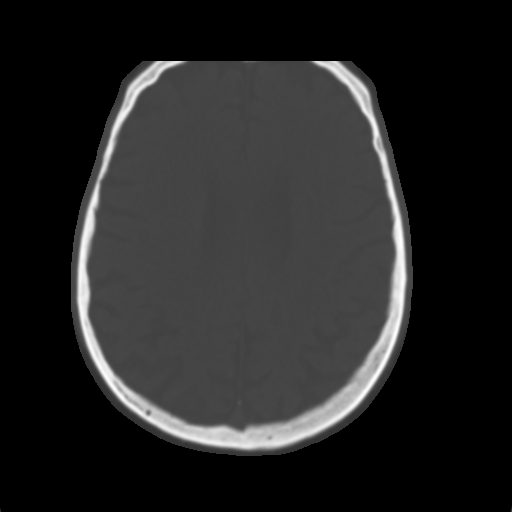
[im 25/33  brain]
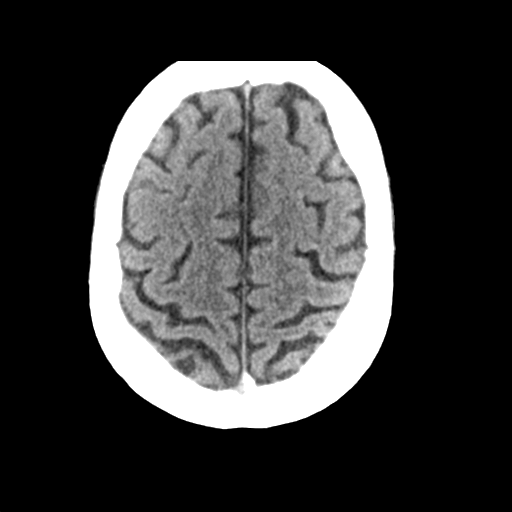
[im 29/33  brain]
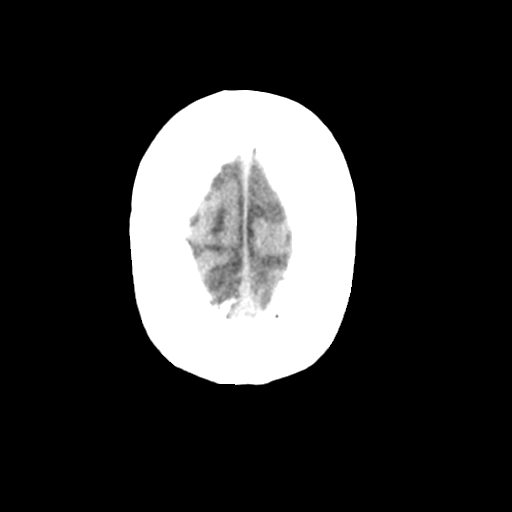

[Series 4: head bone · axial · 0.42mm/px · z∈[-56,-24]mm · 3 of 82 slices shown]
[im 9/82  bone]
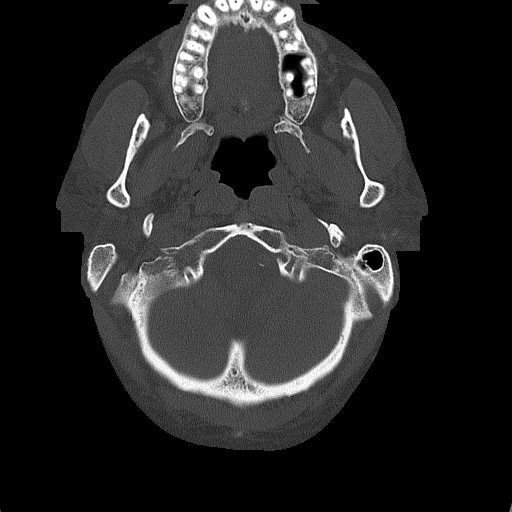
[im 17/82  bone]
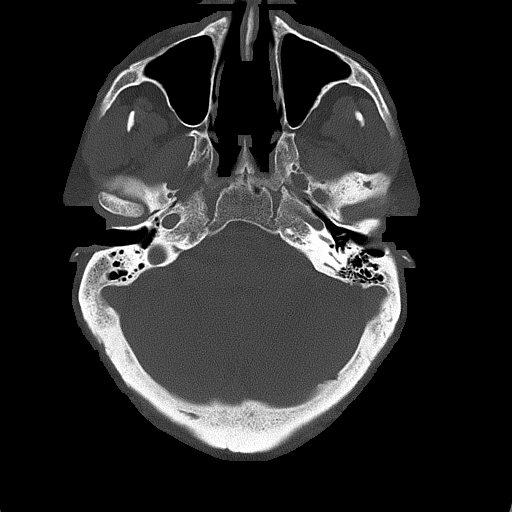
[im 25/82  bone]
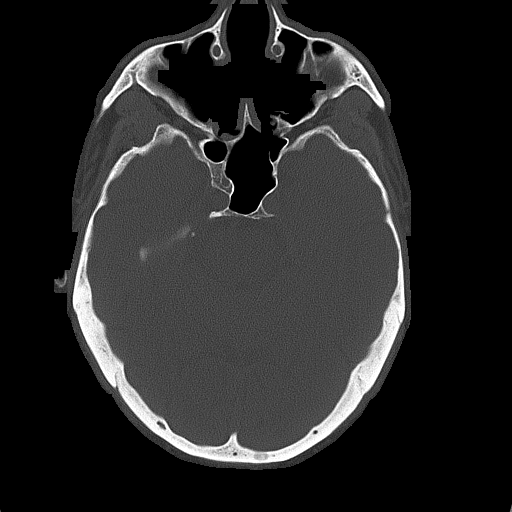

[Series 5: head without cor · coronal · non-contrast · 0.34mm/px · 3 of 72 slices shown]
[im 24/72  brain]
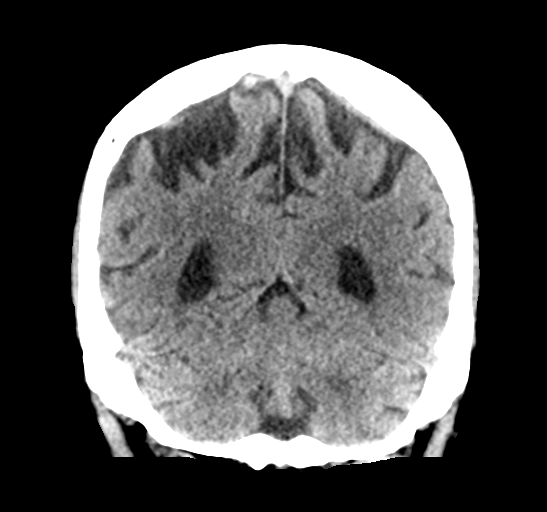
[im 32/72  brain]
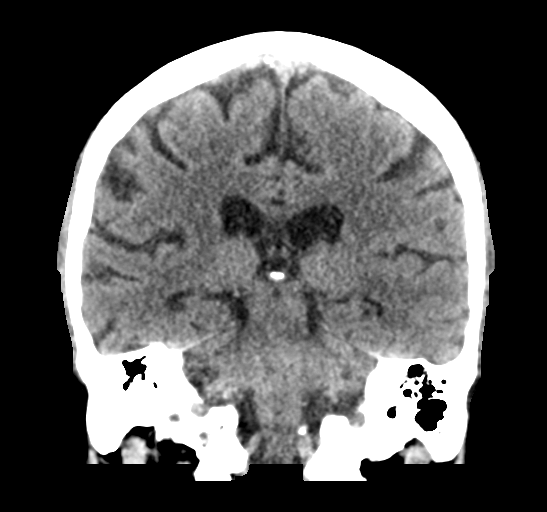
[im 40/72  brain]
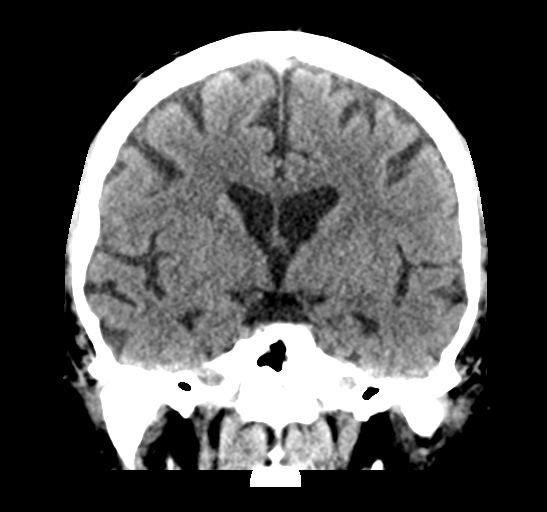

[Series 6: head without sag · sagittal · non-contrast · 0.32mm/px · 3 of 61 slices shown]
[im 21/61  brain]
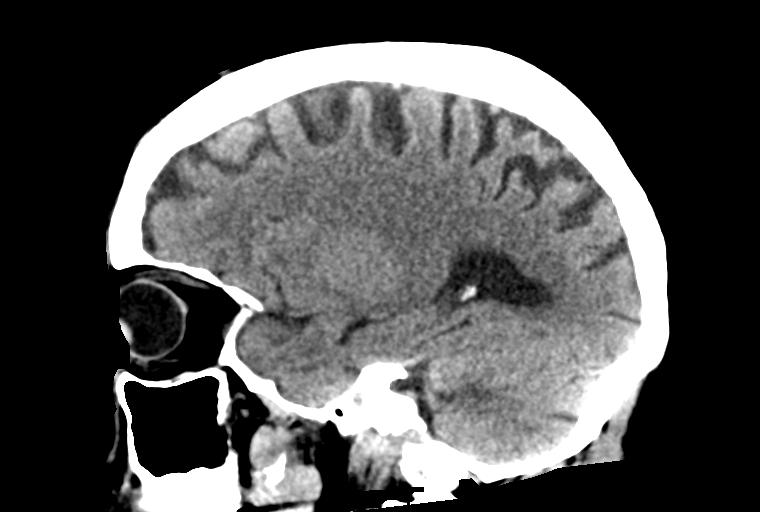
[im 31/61  brain]
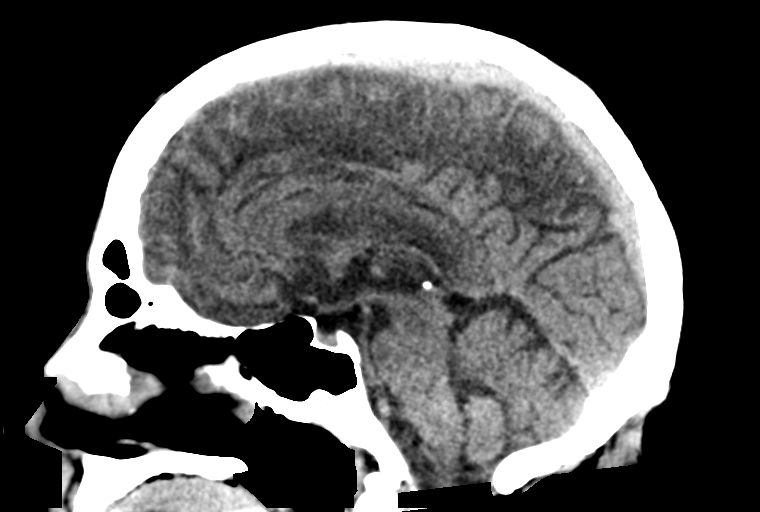
[im 41/61  brain]
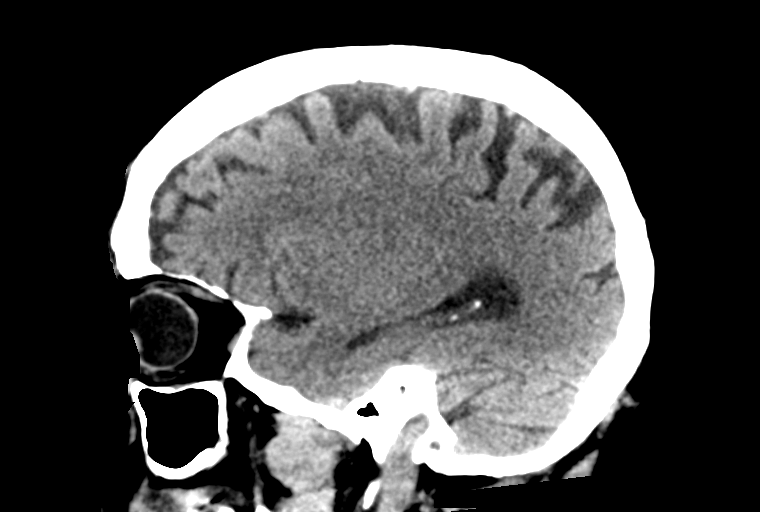

[16 of 47 positions shown; findings below may reference images not displayed]

FINDINGS: Brain: There is no acute intracranial hemorrhage, mass effect, or
edema. Gray-white differentiation is preserved. There is no
extra-axial fluid collection. Prominence of the ventricles and sulci
reflects minor parenchymal volume loss.

Vascular: No hyperdense vessel or unexpected calcification.

Skull: Calvarium is unremarkable.

Sinuses/Orbits: Evidence of prior sinonasal surgery with mild
mucosal thickening. Orbits are unremarkable.

Other: None.
IMPRESSION: No acute or significant intracranial abnormality.

## 2022-09-24 IMAGING — CT CT ABD-PELV W/ CM
2 of 5 series · 15 of 46 positions shown, 17 images · IV contrast (Omni 300)
Comparison: No prior CT abdomen, correlation is made with CT pelvis
09/06/2021

CLINICAL DATA: Recurrent right groin pain

EXAM:
CT ABDOMEN AND PELVIS WITH CONTRAST
TECHNIQUE: Multidetector CT imaging of the abdomen and pelvis was performed
using the standard protocol following bolus administration of
intravenous contrast.

[Series 3: a/p w/ 5mm · axial · 0.83mm/px · z∈[+643,+1158]mm · 12 of 115 slices shown, 14 images]
[im 6/115  soft-tissue]
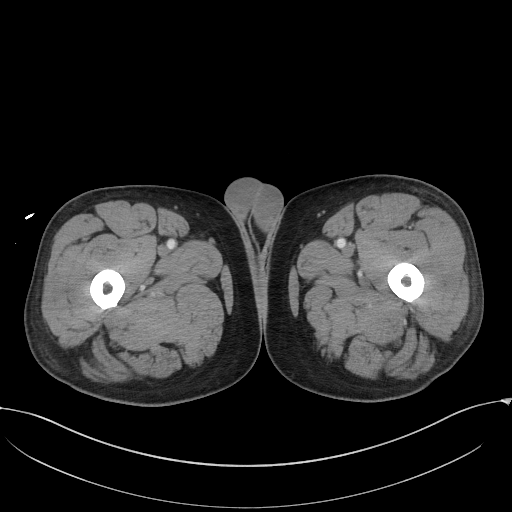
[im 6/115  bone]
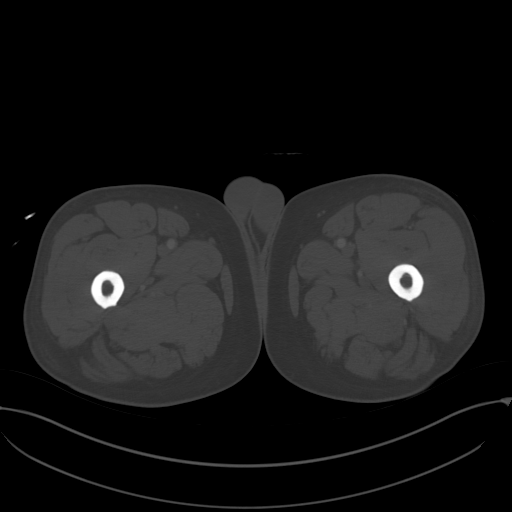
[im 18/115  soft-tissue]
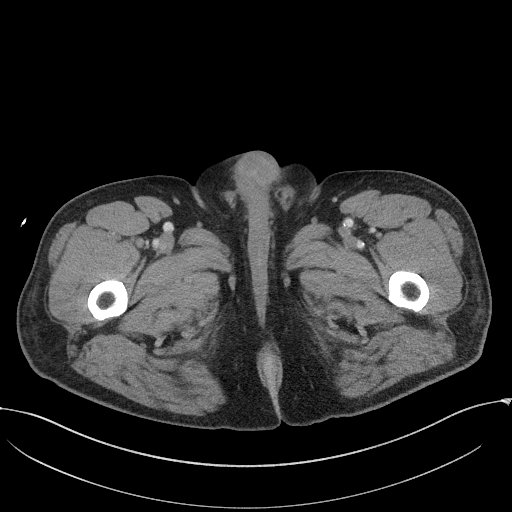
[im 23/115  soft-tissue]
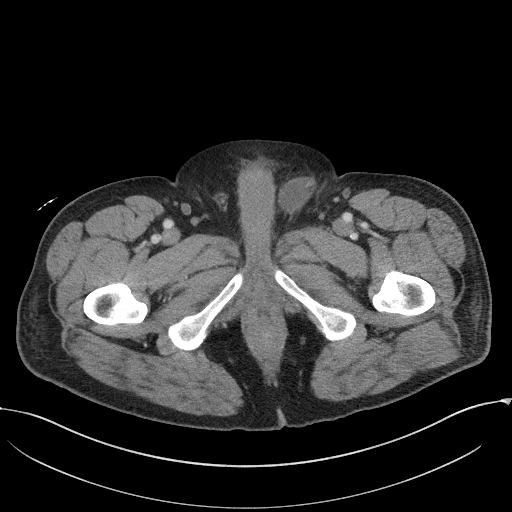
[im 35/115  soft-tissue]
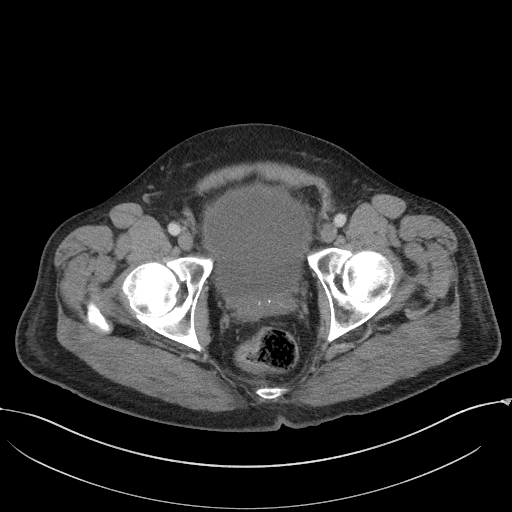
[im 46/115  soft-tissue]
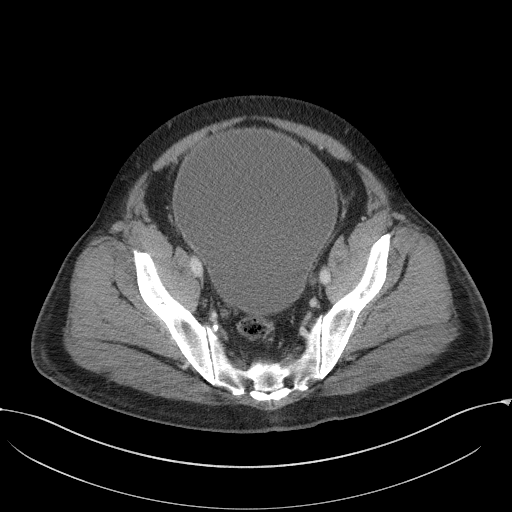
[im 52/115  soft-tissue]
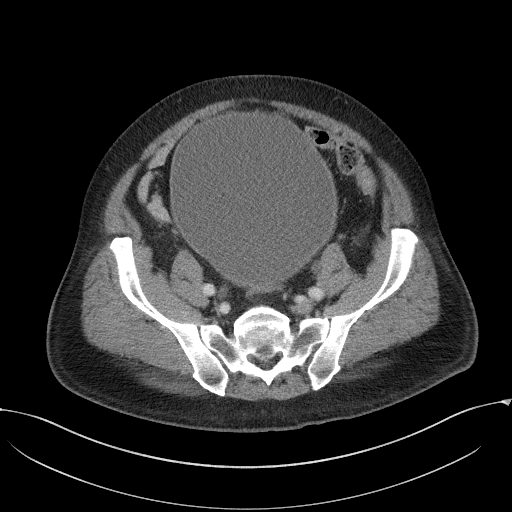
[im 63/115  soft-tissue]
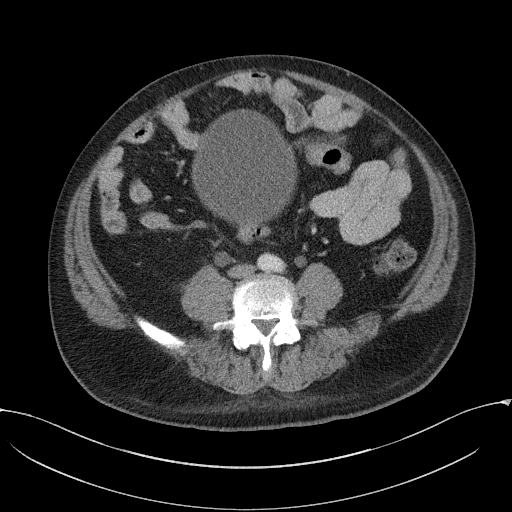
[im 69/115  soft-tissue]
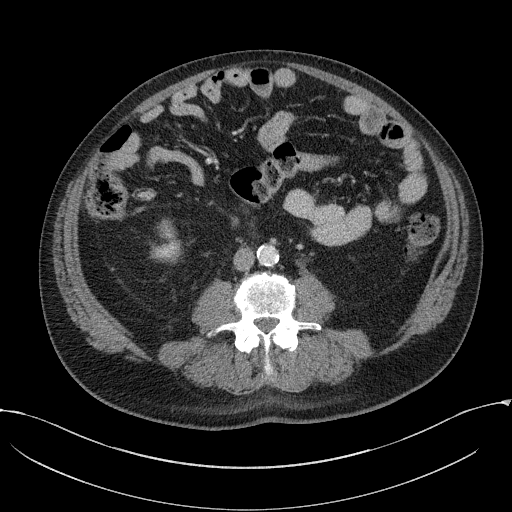
[im 80/115  soft-tissue]
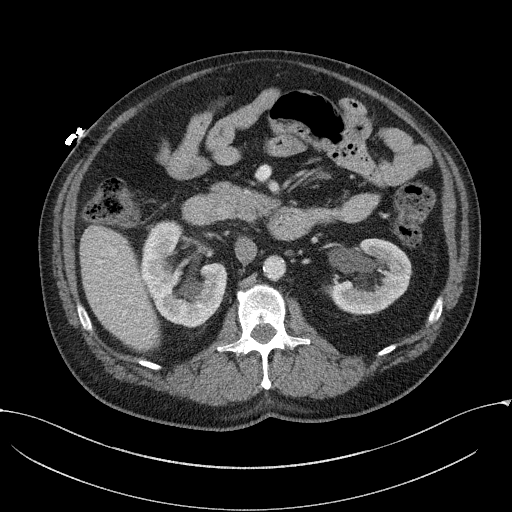
[im 80/115  bone]
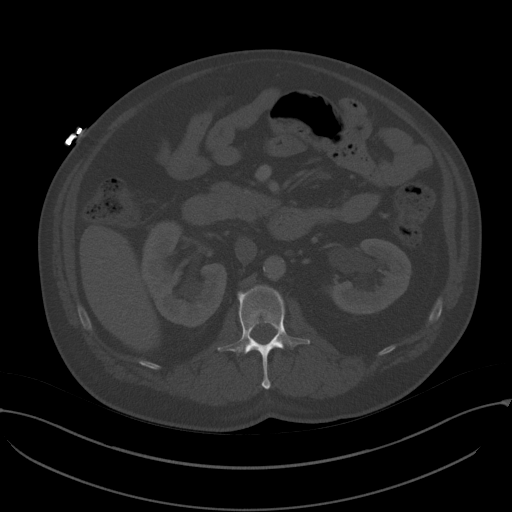
[im 92/115  soft-tissue]
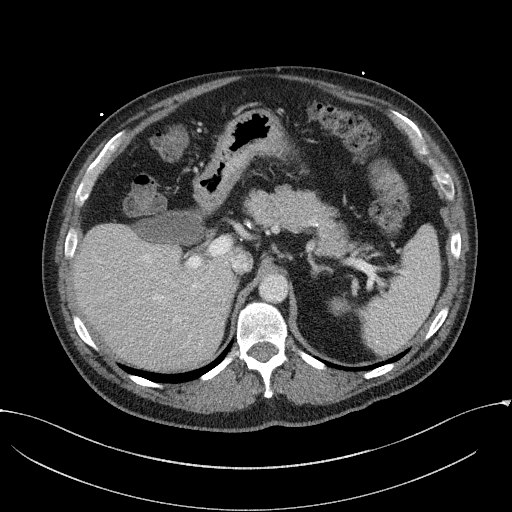
[im 97/115  soft-tissue]
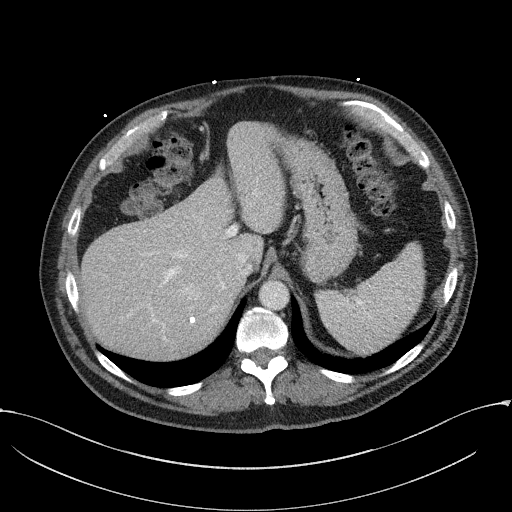
[im 109/115  soft-tissue]
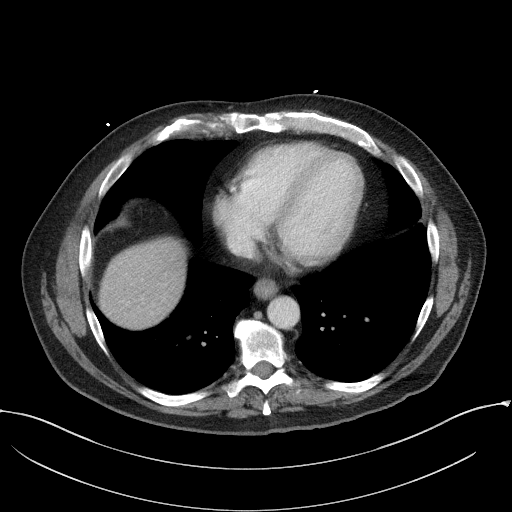

[Series 6: a/p w/ cor · coronal · 0.90mm/px · 3 of 162 slices shown]
[im 54/162  soft-tissue]
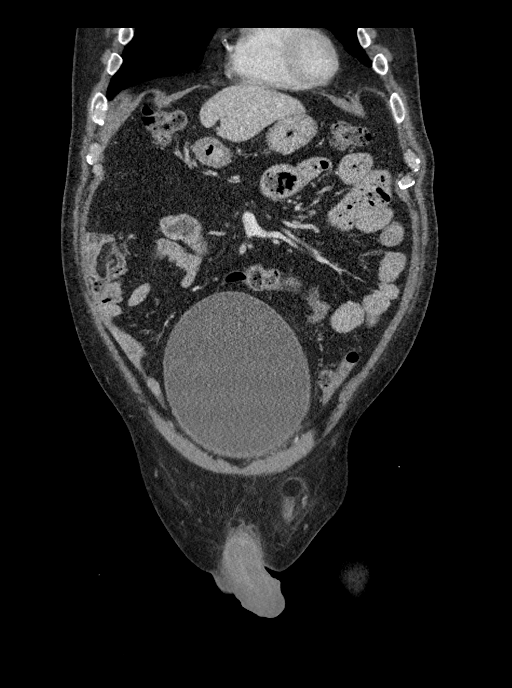
[im 72/162  soft-tissue]
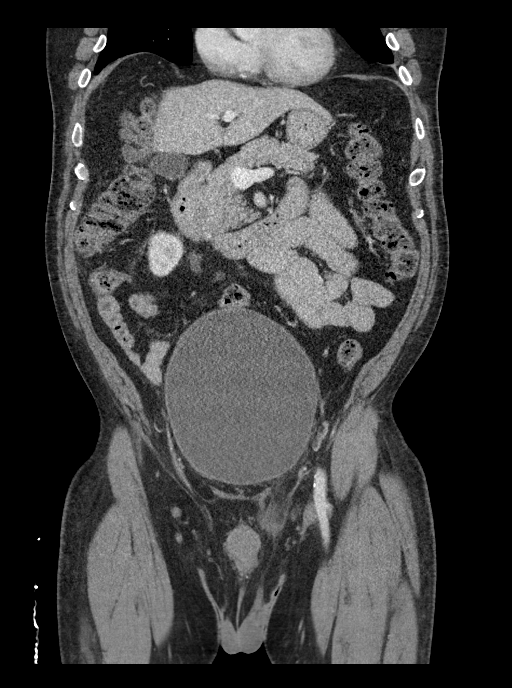
[im 90/162  soft-tissue]
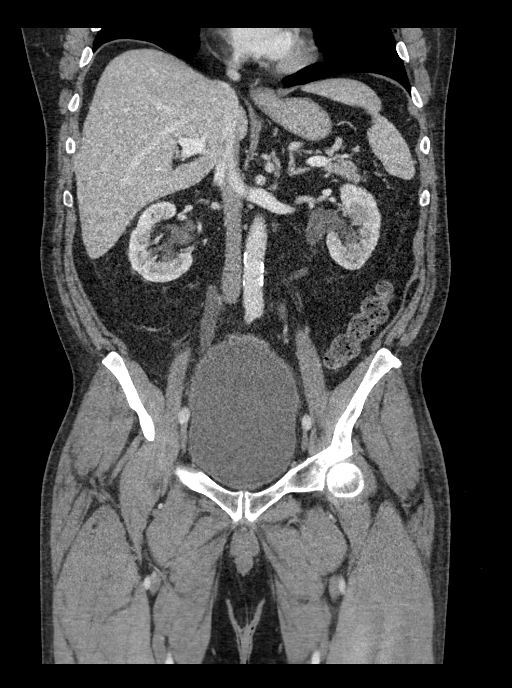

[15 of 46 positions shown; findings below may reference images not displayed]

RADIATION DOSE REDUCTION: This exam was performed according to the
departmental dose-optimization program which includes automated
exposure control, adjustment of the mA and/or kV according to
patient size and/or use of iterative reconstruction technique.

CONTRAST:  100mL OMNIPAQUE IOHEXOL 300 MG/ML  SOLN
FINDINGS: Lower chest: Linear atelectasis and/or scarring. No focal pulmonary
opacity or pleural effusion. No pericardial effusion.

Hepatobiliary: Calcifications in the liver, likely sequela of prior
granulomatous disease. No additional hepatic lesion is seen. No
intra or extrahepatic biliary ductal dilatation. The hepatic and
portal veins are patent. The gallbladder is unremarkable.

Pancreas: Unremarkable. No pancreatic ductal dilatation or
surrounding inflammatory changes.

Spleen: Normal in size. Calcifications within the spleen, likely
sequela of prior granulomatous disease.

Adrenals/Urinary Tract: The adrenal glands are unremarkable.
Moderate right and mild left hydronephrosis, with
right-greater-than-left ureterectasis, and an enlarged, fluid-filled
bladder, which is without focal wall abnormality. The kidneys
enhance symmetrically. No nephrolithiasis is seen within the kidneys
nor within the ureters or bladder.

Stomach/Bowel: Stomach is within normal limits. No evidence of bowel
wall thickening, distention, or inflammatory changes.

Vascular/Lymphatic: Aortic atherosclerosis. Patent vasculature. No
lymphadenopathy.

Reproductive: Brachytherapy seeds in the prostate.

Other: Redemonstrated moderate left inguinal hernia, which contains
fat and a small volume of fluid. Small right inguinal hernia, also
fat containing.

Musculoskeletal: No acute osseous abnormality. Redemonstrated bone
island in the left acetabulum.
IMPRESSION: 1. Moderate right and mild left hydronephrosis, with a very
distended bladder, concerning for urinary retention and obstruction,
which may be secondary to treatment for prostate cancer.
2. Moderate left and small right fat containing inguinal hernias. No
evidence of bowel entrapment or obstruction.

## 2022-10-14 ENCOUNTER — Ambulatory Visit: Payer: BC Managed Care – PPO | Admitting: Neurology

## 2022-10-20 ENCOUNTER — Telehealth: Payer: Self-pay | Admitting: Neurology

## 2022-10-20 NOTE — Telephone Encounter (Signed)
Called pt wife back. Wife states he is not putting clothes on properly. This happens daily.  Before, she was told by Dr. Felecia Shelling that he does not have dementia but wife questioning d/t family hx of dementia on his side and worsening memory concerns. Scheduled appt for 10/27/22 at 9am, check in 830am.

## 2022-10-20 NOTE — Telephone Encounter (Signed)
Pt's wife called needing to speak to the RN regarding some issues that have come up with the pt. Please advise.

## 2022-10-27 ENCOUNTER — Ambulatory Visit: Payer: BC Managed Care – PPO | Admitting: Neurology

## 2022-10-27 ENCOUNTER — Encounter: Payer: Self-pay | Admitting: Neurology

## 2022-10-27 VITALS — BP 155/71 | HR 77 | Ht 67.0 in | Wt 227.5 lb

## 2022-10-27 DIAGNOSIS — C61 Malignant neoplasm of prostate: Secondary | ICD-10-CM

## 2022-10-27 DIAGNOSIS — F418 Other specified anxiety disorders: Secondary | ICD-10-CM

## 2022-10-27 DIAGNOSIS — G3184 Mild cognitive impairment, so stated: Secondary | ICD-10-CM

## 2022-10-27 DIAGNOSIS — R413 Other amnesia: Secondary | ICD-10-CM | POA: Diagnosis not present

## 2022-10-27 DIAGNOSIS — G4733 Obstructive sleep apnea (adult) (pediatric): Secondary | ICD-10-CM

## 2022-10-27 NOTE — Progress Notes (Signed)
[facility]  PATIENT: [name] DOB: 31-Jan-1956  REFERRING DOCTOR OR PCP: Vedia Coffer, PA-C SOURCE: Patient, notes from primary care  _________________________________   HISTORICAL  CHIEF COMPLAINT:  Chief Complaint  Patient presents with   Follow-up    Pt here in room 43, here with wife. Here to re-elevate memory. Wife sent my chart message with several complaints of patient memory. ( Paper printed and left for provider to review) Reports needs new handicap form as well. MMSE:22    HISTORY OF PRESENT ILLNESS:  [name] is a 67 y.o. man with memory concerns.  Update 10/27/2022 He feels his mild memory difficulties and cognitive issues are better,   .   However, his wife notes he has had more trouble.  He had trpuble getting dressed (clothes backwards and taking longer a few times)     He was started on CPAP and feels more awake and alert.   He still has some reduced memory but feel he is doing better.     He also has anxiety/depression --- wife feels depression is slightly better and anxiety moderately better since Buspar was added. .  Previously,  I had added Abilfy but he felt worse and stopped.    He is on Celexa 40 mg daily and buspirone 7.5 mg po qAM and 15 mg po qHS.     Physically he is doing well.  He denies gait issues though balance is sometimes off..   No numbness or weakness though his hands feel stiff.    He snores and has witnessed OSA.   He had a home sleep study 05/20/2022 showing moderate OSA and CPAP was initiated.  He feels more awake and alert on CPAP.Marland Kitchen  No REM behavior disorder.  No insomnia.   He has had some trouble with using CPAP  He has HTN, NIDDM, hyperlipidemia,   Memory history: He has had difficulty with short term memory since diagnosed with prostate cancer in 2022.  He felt that he worsened after his initial Lupron in 2022 and more issues after his second round early 2023.      He feels his cognitive issues are  only memory related and he denies difficulty with visual-spatial tasks, executive function or language.  When he has difficulty remembering something, he usually will recall with hints.     He has no difficulty doing chores, even complex engine work on a car.     His wife noted he was more  anxious and depressed since the prostate cancer issues.   He has been very tearful at times   He is apathetic.  He  has been on citalopram 20 mg po daily x one year and buspirone 5 mg po qd (was bid but became sleepy).   .   He also has had more depression since ED issues related to the PSA and low testosterone.        Viagra caused HA and is reluctant to do injections for ED.      At his visit with primary care in mid 2023, he had an MMSE was 21/30.  In September 2023,  he scored 10/30 on the MoCA in our office.      B12 and TSH were normal in 2023.    He was on chemotherapy for prostate cancer and is concerned      10/27/2022    9:02 AM 08/05/2022    3:58 PM  MMSE - Mini Mental State Exam  Orientation to  time 4 4  Orientation to Place 5 5  Registration 3 3  Attention/ Calculation 0 1  Recall 3 3  Language- name 2 objects 2 2  Language- repeat 1 1  Language- follow 3 step command 2 3  Language- read & follow direction 1 1  Write a sentence 1 1  Copy design 0 0  Total score 22 24        05/22/2022    2:13 PM  Montreal Cognitive Assessment   Visuospatial/ Executive (0/5) 1  Naming (0/3) 3  Attention: Read list of digits (0/2) 1  Attention: Read list of letters (0/1) 0  Attention: Serial 7 subtraction starting at 100 (0/3) 0  Language: Repeat phrase (0/2) 0  Language : Fluency (0/1) 0  Abstraction (0/2) 2  Delayed Recall (0/5) 1  Orientation (0/6) 2  Total 10   Imaging personally reviewed: CT head 01/23/2022 showed minimal atrophy and no acute findings.   Some calcification in left vertebral artery.      Laboratory results: 04/22/2022 lipid panel showed very elevated triglycerides of 464  but normal cholesterol.  CMP showed elevated glucose and elevated creatinine (1.6) the estimated GFR was 47.  Hemoglobin A1c was 7.7 mild anemia with hemoglobin 11.7.  02/24/2022 testosterone level less than 20 02/20/2022: TSH was normal  REVIEW OF SYSTEMS: Constitutional: No fevers, chills, sweats, or change in appetite Eyes: No visual changes, double vision, eye pain Ear, nose and throat: No hearing loss, ear pain, nasal congestion, sore throat Cardiovascular: No chest pain, palpitations Respiratory:  No shortness of breath at rest or with exertion.   No wheezes GastrointestinaI: No nausea, vomiting, diarrhea, abdominal pain, fecal incontinence Genitourinary:  No dysuria, urinary retention or frequency.  No nocturia. Musculoskeletal:  No neck pain, back pain Integumentary: No rash, pruritus, skin lesions Neurological: as above Psychiatric: No depression at this time.  No anxiety Endocrine: No palpitations, diaphoresis, change in appetite, change in weigh or increased thirst Hematologic/Lymphatic:  No anemia, purpura, petechiae. Allergic/Immunologic: No itchy/runny eyes, nasal congestion, recent allergic reactions, rashes  ALLERGIES: Allergies  Allergen Reactions   Codeine     Syncopal Episode Patient reports he CAN take "CODONES" WITHOUT any problems   Lisinopril Cough         HOME MEDICATIONS:  Current Outpatient Medications:    ARIPiprazole (ABILIFY) 5 MG tablet, TAKE 1 TABLET (5 MG TOTAL) BY MOUTH DAILY., Disp: 90 tablet, Rfl: 2   aspirin EC 81 MG tablet, Take 81 mg by mouth daily. Swallow whole., Disp: , Rfl:    busPIRone (BUSPAR) 15 MG tablet, Take 1 tablet (15 mg total) by mouth 2 (two) times daily., Disp: 60 tablet, Rfl: 11   citalopram (CELEXA) 40 MG tablet, TAKE 1 TABLET BY MOUTH EVERY DAY, Disp: 90 tablet, Rfl: 2   cyanocobalamin (VITAMIN B12) 1000 MCG tablet, Take 1 tablet by mouth daily., Disp: , Rfl:    Diclofenac Sodium CR 100 MG 24 hr tablet, Take 100 mg by mouth  daily., Disp: , Rfl:    esomeprazole (NEXIUM) 40 MG capsule, Take 40 mg by mouth daily at 12 noon., Disp: , Rfl:    furosemide (LASIX) 40 MG tablet, Take 20 mg by mouth daily., Disp: , Rfl:    insulin glargine (LANTUS) 100 UNIT/ML Solostar Pen, Inject 40 Units into the skin at bedtime., Disp: , Rfl:    insulin lispro (HUMALOG) 200 UNIT/ML KwikPen, Use 3 times daily as directed. Max daily dose is 100 units., Disp: , Rfl:  levofloxacin (LEVAQUIN) 500 MG tablet, Take 1 tablet (500 mg total) by mouth daily., Disp: 7 tablet, Rfl: 1   megestrol (MEGACE) 20 MG tablet, Take 20 mg by mouth daily., Disp: , Rfl:    metFORMIN (GLUCOPHAGE) 1000 MG tablet, Take 1,000 mg by mouth 2 (two) times daily with a meal., Disp: , Rfl:    olmesartan (BENICAR) 20 MG tablet, Take 20 mg by mouth daily., Disp: , Rfl:    ondansetron (ZOFRAN) 4 MG tablet, Take by mouth., Disp: , Rfl:    rosuvastatin (CRESTOR) 40 MG tablet, Take 40 mg by mouth daily., Disp: , Rfl:    tamsulosin (FLOMAX) 0.4 MG CAPS capsule, TAKE 1 CAPSULE BY MOUTH DAILY 1 HOUR AFTER THE LAST MEAL OF THE DAY, Disp: , Rfl:    Vitamin D, Ergocalciferol, (DRISDOL) 1.25 MG (50000 UNIT) CAPS capsule, Take 1 capsule (50,000 Units total) by mouth every 7 (seven) days., Disp: 13 capsule, Rfl: 1  PAST MEDICAL HISTORY: Past Medical History:  Diagnosis Date   Diabetes mellitus without complication (HCC)    GERD (gastroesophageal reflux disease)    PONV (postoperative nausea and vomiting)    Prostate cancer ([place])     PAST SURGICAL HISTORY: Past Surgical History:  Procedure Laterality Date   HERNIA REPAIR     groin as infant   NOSE SURGERY     PROSTATE BIOPSY N/A 02/28/2021   Procedure: PROSTATE BIOPSY URONAV PROSTATE;  Surgeon: Royston Cowper, MD;  Location: [facility] ORS;  Service: Urology;  Laterality: N/A;   TOENAIL EXCISION      FAMILY HISTORY: No family history on file.  SOCIAL HISTORY:  Social History   Socioeconomic History   Marital status:  Married    Spouse name: Not on file   Number of children: Not on file   Years of education: Not on file   Highest education level: Not on file  Occupational History   Not on file  Tobacco Use   Smoking status: Never   Smokeless tobacco: Never  Vaping Use   Vaping Use: Never used  Substance and Sexual Activity   Alcohol use: Not Currently   Drug use: Never   Sexual activity: Not on file  Other Topics Concern   Not on file  Social History Narrative   Not on file   Social Determinants of Health   Financial Resource Strain: Not on file  Food Insecurity: Not on file  Transportation Needs: Not on file  Physical Activity: Not on file  Stress: Not on file  Social Connections: Not on file  Intimate Partner Violence: Not on file     PHYSICAL EXAM  Vitals:   10/27/22 0846  BP: (!) 155/71  Pulse: 77  Weight: 227 lb 8 oz (103.2 kg)  Height: '5\' 7"'$  (1.702 m)    Body mass index is 35.63 kg/m.   General: The patient is well-developed and well-nourished and in no acute distress  HEENT:  Head is [place]/AT.  Sclera are anicteric.     Skin: Extremities are without rash or  edema.  Musculoskeletal:  Back is nontender  Neurologic Exam  Mental status: He scored 24/30 on the [facility] cognitive assessment (mild cognitive impairment) speech is normal.  Cranial nerves: Extraocular movements are full.  Facial strength and sensation was normal.  No obvious hearing deficits are noted.  Motor: Slight intention tremor in hands.  Muscle bulk is normal.  No cogwheeling.  Tone is normal. Strength is  5 / 5 in all 4 extremities.  Sensory: Sensory testing is intact to pinprick, soft touch and vibration sensation in all 4 extremities.  Coordination: Cerebellar testing reveals good finger-nose-finger and heel-to-shin bilaterally.  Gait and station: Station is normal.   Gait is fairly normal.  Tandem gait is mildly wide.  He does not not have retropulsion.. Romberg is negative.   Reflexes:  Deep tendon reflexes are symmetric and normal bilaterally.      DIAGNOSTIC DATA (LABS, IMAGING, TESTING) - I reviewed patient records, labs, notes, testing and imaging myself where available.  Lab Results  Component Value Date   WBC 8.0 01/27/2022   HGB 13.2 01/27/2022   HCT 39.6 01/27/2022   MCV 85.0 01/27/2022   PLT 278 01/27/2022      Component Value Date/Time   NA 139 01/27/2022 1027   K 3.8 01/27/2022 1027   CL 109 01/27/2022 1027   CO2 17 (L) 01/27/2022 1027   GLUCOSE 160 (H) 01/27/2022 1027   BUN 19 01/27/2022 1027   CREATININE 1.34 (H) 01/27/2022 1027   CALCIUM 9.6 01/27/2022 1027   PROT 6.9 01/27/2022 1027   ALBUMIN 3.8 01/27/2022 1027   AST 16 01/27/2022 1027   ALT 12 01/27/2022 1027   ALKPHOS 68 01/27/2022 1027   BILITOT 0.7 01/27/2022 1027   GFRNONAA 60 (L) 01/27/2022 1027       ASSESSMENT AND PLAN  Memory loss  Mild cognitive impairment - Plan: ATN PROFILE  OSA (obstructive sleep apnea)  Depression with anxiety  Prostate CA ([place])  He has mild cognitive impairment,Fairly stable compared to last vsit.   It is possible that mood issues or worsening his cognition. Try to increase his BuSpar to 7.5 mg p.o. twice daily and 15 mg at night.  If that does not help his anxiety/depression further it may be good to refer him to psychiatry.   He is advised to continue CPAP.   OSA can also play a role in cognitive issues.  3.   Check A/T/N test for AD.  If normal, consider neurocognitive testing with neuropsychology.   4.   Return in 1 year or sooner if there are new or worsening neurologic symptoms.  Raeonna Milo A. Felecia Shelling, MD, [facility] 05/30/9891, 1:19 AM Certified in Neurology, Clinical Neurophysiology, Sleep Medicine and Neuroimaging  [facility] [address], [place], [place] [address] [phone]

## 2022-10-30 ENCOUNTER — Encounter: Payer: Self-pay | Admitting: Neurology

## 2022-10-30 ENCOUNTER — Other Ambulatory Visit: Payer: Self-pay | Admitting: Neurology

## 2022-10-30 ENCOUNTER — Telehealth: Payer: Self-pay | Admitting: Neurology

## 2022-10-30 DIAGNOSIS — G3184 Mild cognitive impairment, so stated: Secondary | ICD-10-CM

## 2022-10-30 LAB — ATN PROFILE
A -- Beta-amyloid 42/40 Ratio: 0.098 — ABNORMAL LOW (ref >0.102)
Beta-amyloid 40: 222.76 pg/mL
Beta-amyloid 42: 21.72 pg/mL
N -- NfL, Plasma: 3.78 pg/mL (ref 0.00–4.61)
T -- p-tau181: 1.59 pg/mL — ABNORMAL HIGH (ref 0.00–0.97)

## 2022-10-30 MED ORDER — DONEPEZIL HCL 5 MG PO TABS: 5.0000 mg | ORAL_TABLET | Freq: Every day | ORAL | 2 refills | Status: DC

## 2022-10-30 NOTE — Telephone Encounter (Signed)
Wife also called. Sent phone note to Dr. Felecia Shelling to review

## 2022-10-30 NOTE — Telephone Encounter (Signed)
Please call and offer appt date/time below that MD approved, thank you

## 2022-10-30 NOTE — Telephone Encounter (Signed)
Pt's wife called hysterical stating that she saw on her husbands mychart that he has Alzheimer's and she is wanting to discuss with RN or MD. Please advise.

## 2022-10-30 NOTE — Telephone Encounter (Signed)
I spoke to Mrs. [name].  Arne was at work but will be back later this afternoon.  I discussed with her the results of the amyloid and [id number] blood work.  They are consistent with Alzheimer's disease.  That test correlates well with the amyloid PET scan that is considered the gold standard for diagnosing Alzheimer's disease.  Of note, he does not have significant or regional atrophy on the CT scan.  His score is in the mild cognitive impairment level.  [name] is 19 and still working.  I believe we need to pin down this diagnosis more definitively to help guide therapy and life options.   I will order an amyloid PET scan.  I would like to see a few days after that scan so we can go over the results and further options.

## 2022-10-30 NOTE — Telephone Encounter (Signed)
Per Dr. Felecia Shelling, he spoke with wife and will call husband around 5pm to discuss.

## 2022-10-30 NOTE — Progress Notes (Signed)
I spoke to [place] about the blood test.  The abnormal values increase the likelihood that he has Alzheimer's disease but we need to check the PET scan to be more certain.  I would like to go ahead and start donepezil 5 mg.  The PET scan looks to be scheduled in a couple weeks and he will see me a few days later for visit.  If he tolerates the donepezil we will increase the dose.  Based on the results of the PET scan we might also discuss other options including lecanemab.

## 2022-10-30 NOTE — Telephone Encounter (Signed)
Wife also sent mychart message: "Can someone please give me a call I need to know if my husband's OK saw the summary that said that some of the stuff was consistent with Alzheimer's please call me "

## 2022-10-30 NOTE — Telephone Encounter (Signed)
Ok to schedule pt for 11/17/22 at 3:30pm with you for appt?

## 2022-10-30 NOTE — Telephone Encounter (Signed)
[employer] no auth required via [place] sent to [place]

## 2022-11-03 ENCOUNTER — Encounter: Payer: Self-pay | Admitting: *Deleted

## 2022-11-03 NOTE — Telephone Encounter (Signed)
Pt's wife called and agreed to this time and date. Needing RN to [employer].

## 2022-11-03 NOTE — Telephone Encounter (Signed)
Noted, scheduled pt

## 2022-11-03 NOTE — Telephone Encounter (Signed)
Called and LVM for pt or pt wife to call back and schedule an appt on this date and time.

## 2022-11-14 ENCOUNTER — Encounter (HOSPITAL_COMMUNITY)
Admission: RE | Admit: 2022-11-14 | Discharge: 2022-11-14 | Disposition: A | Discharge status: Home / Self Care | Payer: BC Managed Care – PPO | Source: Ambulatory Visit | Attending: Neurology | Admitting: Neurology

## 2022-11-14 DIAGNOSIS — R413 Other amnesia: Secondary | ICD-10-CM | POA: Insufficient documentation

## 2022-11-14 DIAGNOSIS — G3184 Mild cognitive impairment, so stated: Secondary | ICD-10-CM | POA: Insufficient documentation

## 2022-11-14 DIAGNOSIS — F0280 Dementia in other diseases classified elsewhere without behavioral disturbance: Secondary | ICD-10-CM | POA: Diagnosis not present

## 2022-11-14 DIAGNOSIS — I680 Cerebral amyloid angiopathy: Secondary | ICD-10-CM | POA: Insufficient documentation

## 2022-11-14 DIAGNOSIS — G300 Alzheimer's disease with early onset: Secondary | ICD-10-CM | POA: Diagnosis not present

## 2022-11-14 MED ORDER — FLORBETAPIR F 18 500-1900 MBQ/ML IV SOLN
10.1000 | Freq: Once | INTRAVENOUS | Status: AC
  Administered 2022-11-14: 10.1 via INTRAVENOUS

## 2022-11-17 ENCOUNTER — Ambulatory Visit: Payer: BC Managed Care – PPO | Admitting: Neurology

## 2022-11-17 ENCOUNTER — Encounter: Payer: Self-pay | Admitting: Neurology

## 2022-11-17 VITALS — BP 151/78 | HR 64 | Ht 67.0 in | Wt 232.5 lb

## 2022-11-17 DIAGNOSIS — G4733 Obstructive sleep apnea (adult) (pediatric): Secondary | ICD-10-CM

## 2022-11-17 DIAGNOSIS — F418 Other specified anxiety disorders: Secondary | ICD-10-CM | POA: Diagnosis not present

## 2022-11-17 DIAGNOSIS — F02A0 Dementia in other diseases classified elsewhere, mild, without behavioral disturbance, psychotic disturbance, mood disturbance, and anxiety: Secondary | ICD-10-CM

## 2022-11-17 DIAGNOSIS — G300 Alzheimer's disease with early onset: Secondary | ICD-10-CM | POA: Diagnosis not present

## 2022-11-17 MED ORDER — DONEPEZIL HCL 10 MG PO TABS: 5.0000 mg | ORAL_TABLET | Freq: Every day | ORAL | 3 refills | Status: DC

## 2022-11-17 NOTE — Progress Notes (Signed)
[facility]  PATIENT: [name] DOB: 1956-05-12  REFERRING DOCTOR OR PCP: Vedia Coffer, PA-C SOURCE: Patient, notes from primary care  _________________________________   HISTORICAL  CHIEF COMPLAINT:  Chief Complaint  Patient presents with   Follow-up    Pt in room 10 here for memory loss. Pt in room with wife. Pt said pt is doing well, forgets to take medication at times. No falls. Pt reports constant ringing in ears x 2 months now, reports dizziness when standing at time. Has appointment with ENT in March. MMSE:22    HISTORY OF PRESENT ILLNESS:  [name] is a 67 y.o. man with memory concerns.  Update 11/17/2022 Since the last visit he has had an amyloid PET scan.  It showed avid tracer uptake in the cerebral hemispheres but not in the cerebellum, a pattern that is typical for Alzheimer's disease.  I discussed this with him and his wife.  We can now be more certain of the diagnosis.  At the last visit I started donepezil and he is tolerating it well.  I will increase that dose to 10 mg.  We had a long conversation about the newer antiamyloid monoclonal antibodies such as lecanemab and the donanemab (which will probably be available within a couple months).  I discussed the modest benefit in the clinical trials and also discussed risks, especially risk of ARIA-E and -H.     He reports that he is still working and would like to continue to work until his 67th birthday in 9 months.  He does a less physical job now than he did in the past at [employer].  He reports that there has not been difficulties noted at his job.  Memory issues and cognitive issues were better after treating the anxiety with BuSpar though he continues to have some difficulties with executive function type tasks and memory.  He also has anxiety/depression --- wife feels depression is slightly better and anxiety moderately better since Buspar was added. .  Previously,   Claudie Fisherman was not well tolerated.    He is on Celexa 40 mg daily and buspirone 7.5 mg po qAM and 15 mg po qHS.     Physically he is doing well.  He denies gait issues though balance is sometimes off..   No numbness or weakness though his hands feel stiff.    He snores and has witnessed OSA.   He had a home sleep study 05/20/2022 showing moderate OSA and CPAP was initiated.  He initially felt more awake and alert on CPAP when he uses it but this has become more difficulty.  He feels claustrophobic when he wears the mask so has not used it is much lately   we discussed an oral appliance.   We also discussed the Inspire device (has AHI=28).     He might have some REM behavior issues.  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     He has HTN, NIDDM, hyperlipidemia,   He had prostate cancer and had chemo.  Memory issues seem to worsen after his chemotherapy  His grandmother and 2 grand-aunts had AD  Memory history: He has had difficulty with short term memory since diagnosed with prostate cancer in 2022.  He felt that he worsened after his initial Lupron in 2022 and more issues after his second round early 2023.      He feels his cognitive issues  are only memory related and he denies difficulty with visual-spatial tasks, executive function or language.  When he has difficulty remembering something, he usually will recall with hints.     He has no difficulty doing chores, even complex engine work on a car.     His wife noted he was more  anxious and depressed since the prostate cancer issues.   He has been very tearful at times   He is apathetic.  He  has been on citalopram 20 mg po daily x one year and buspirone 5 mg po qd (was bid but became sleepy).   .   He also has had more depression since ED issues related to the PSA and low testosterone.        Viagra caused HA and is reluctant to do injections for ED.      At his visit with primary care in mid  2023, he had an MMSE was 21/30.  In September 2023,  he scored 10/30 on the MoCA in our office.      B12 and TSH were normal in 2023.    He was on chemotherapy for prostate cancer and is concerned      11/17/2022    3:34 PM 10/27/2022    9:02 AM 08/05/2022    3:58 PM  MMSE - Mini Mental State Exam  Orientation to time '3 4 4  '$ Orientation to Place '5 5 5  '$ Registration '3 3 3  '$ Attention/ Calculation 0 0 1  Recall '3 3 3  '$ Language- name 2 objects '2 2 2  '$ Language- repeat '1 1 1  '$ Language- follow 3 step command '3 2 3  '$ Language- read & follow direction '1 1 1  '$ Write a sentence '1 1 1  '$ Copy design 0 0 0  Total score '22 22 24      '$ Imaging personally reviewed: CT head 01/23/2022 showed mild atrophy and no acute findings.   Some calcification in left vertebral artery.    Amyloid-PET 11/14/2022 was consistent with Alzheimer's (positive)    Laboratory results: 04/22/2022 lipid panel showed very elevated triglycerides of 464 but normal cholesterol.  CMP showed elevated glucose and elevated creatinine (1.6) the estimated GFR was 47.  Hemoglobin A1c was 7.7 mild anemia with hemoglobin 11.7.  02/24/2022 testosterone level less than 20 02/20/2022: TSH was normal  REVIEW OF SYSTEMS: Constitutional: No fevers, chills, sweats, or change in appetite Eyes: No visual changes, double vision, eye pain Ear, nose and throat: No hearing loss, ear pain, nasal congestion, sore throat Cardiovascular: No chest pain, palpitations Respiratory:  No shortness of breath at rest or with exertion.   No wheezes GastrointestinaI: No nausea, vomiting, diarrhea, abdominal pain, fecal incontinence Genitourinary: He has had prostate surgery and initially had retention Musculoskeletal:  No neck pain, back pain Integumentary: No rash, pruritus, skin lesions Neurological: as above Psychiatric: No depression at this time.  He has anxiety  endocrine: No palpitations, diaphoresis, change in appetite, change in weigh or increased  thirst Hematologic/Lymphatic:  No anemia, purpura, petechiae. Allergic/Immunologic: No itchy/runny eyes, nasal congestion, recent allergic reactions, rashes  ALLERGIES: Allergies  Allergen Reactions   Codeine     Syncopal Episode Patient reports he CAN take "CODONES" WITHOUT any problems   Lisinopril Cough         HOME MEDICATIONS:  Current Outpatient Medications:    ARIPiprazole (ABILIFY) 5 MG tablet, TAKE 1 TABLET (5 MG TOTAL) BY MOUTH DAILY., Disp: 90 tablet, Rfl: 2   aspirin EC 81  MG tablet, Take 81 mg by mouth daily. Swallow whole., Disp: , Rfl:    busPIRone (BUSPAR) 15 MG tablet, Take 1 tablet (15 mg total) by mouth 2 (two) times daily., Disp: 60 tablet, Rfl: 11   citalopram (CELEXA) 40 MG tablet, TAKE 1 TABLET BY MOUTH EVERY DAY, Disp: 90 tablet, Rfl: 2   cyanocobalamin (VITAMIN B12) 1000 MCG tablet, Take 1 tablet by mouth daily., Disp: , Rfl:    Diclofenac Sodium CR 100 MG 24 hr tablet, Take 100 mg by mouth daily., Disp: , Rfl:    esomeprazole (NEXIUM) 40 MG capsule, Take 40 mg by mouth daily at 12 noon., Disp: , Rfl:    furosemide (LASIX) 40 MG tablet, Take 20 mg by mouth daily., Disp: , Rfl:    insulin glargine (LANTUS) 100 UNIT/ML Solostar Pen, Inject 40 Units into the skin at bedtime., Disp: , Rfl:    insulin lispro (HUMALOG) 200 UNIT/ML KwikPen, Use 3 times daily as directed. Max daily dose is 100 units., Disp: , Rfl:    metFORMIN (GLUCOPHAGE) 1000 MG tablet, Take 1,000 mg by mouth 2 (two) times daily with a meal., Disp: , Rfl:    Multiple Vitamin (MULTIVITAMIN ADULT PO), Take by mouth., Disp: , Rfl:    olmesartan (BENICAR) 20 MG tablet, Take 20 mg by mouth daily., Disp: , Rfl:    ondansetron (ZOFRAN) 4 MG tablet, Take by mouth., Disp: , Rfl:    rosuvastatin (CRESTOR) 40 MG tablet, Take 40 mg by mouth daily., Disp: , Rfl:    tamsulosin (FLOMAX) 0.4 MG CAPS capsule, TAKE 1 CAPSULE BY MOUTH DAILY 1 HOUR AFTER THE LAST MEAL OF THE DAY, Disp: , Rfl:    Vitamin D,  Ergocalciferol, (DRISDOL) 1.25 MG (50000 UNIT) CAPS capsule, Take 1 capsule (50,000 Units total) by mouth every 7 (seven) days., Disp: 13 capsule, Rfl: 1   donepezil (ARICEPT) 10 MG tablet, Take 0.5 tablets (5 mg total) by mouth at bedtime., Disp: 90 tablet, Rfl: 3   levofloxacin (LEVAQUIN) 500 MG tablet, Take 1 tablet (500 mg total) by mouth daily. (Patient not taking: Reported on 11/17/2022), Disp: 7 tablet, Rfl: 1   megestrol (MEGACE) 20 MG tablet, Take 20 mg by mouth daily. (Patient not taking: Reported on 11/17/2022), Disp: , Rfl:   PAST MEDICAL HISTORY: Past Medical History:  Diagnosis Date   Diabetes mellitus without complication (HCC)    GERD (gastroesophageal reflux disease)    PONV (postoperative nausea and vomiting)    Prostate cancer ([place])     PAST SURGICAL HISTORY: Past Surgical History:  Procedure Laterality Date   HERNIA REPAIR     groin as infant   NOSE SURGERY     PROSTATE BIOPSY N/A 02/28/2021   Procedure: PROSTATE BIOPSY URONAV PROSTATE;  Surgeon: Royston Cowper, MD;  Location: [facility] ORS;  Service: Urology;  Laterality: N/A;   TOENAIL EXCISION      FAMILY HISTORY: No family history on file.  SOCIAL HISTORY:  Social History   Socioeconomic History   Marital status: Married    Spouse name: Not on file   Number of children: Not on file   Years of education: Not on file   Highest education level: Not on file  Occupational History   Not on file  Tobacco Use   Smoking status: Never   Smokeless tobacco: Never  Vaping Use   Vaping Use: Never used  Substance and Sexual Activity   Alcohol use: Not Currently   Drug use: Never  Sexual activity: Not on file  Other Topics Concern   Not on file  Social History Narrative   Not on file   Social Determinants of Health   Financial Resource Strain: Not on file  Food Insecurity: Not on file  Transportation Needs: Not on file  Physical Activity: Not on file  Stress: Not on file  Social Connections: Not on file   Intimate Partner Violence: Not on file     PHYSICAL EXAM  Vitals:   11/17/22 1517  BP: (!) 151/78  Pulse: 64  Weight: 232 lb 8 oz (105.5 kg)  Height: '5\' 7"'$  (1.702 m)    Body mass index is 36.41 kg/m.   General: The patient is well-developed and well-nourished and in no acute distress  HEENT:  Head is [place]/AT.  Sclera are anicteric.     Skin: Extremities are without rash or  edema.  Musculoskeletal:  Back is nontender  Neurologic Exam  Mental status: He scored 22/30 on the [facility] cognitive assessment (mild cognitive impairment) speech is normal.  Cranial nerves: Extraocular movements are full.  Facial strength and sensation was normal.  No obvious hearing deficits are noted.  Motor: Slight intention tremor in hands.  Muscle bulk is normal.  No cogwheeling.  Tone is normal. Strength is  5 / 5 in all 4 extremities.   Sensory: Sensory testing is intact to pinprick, soft touch and vibration sensation in all 4 extremities.  Coordination: Cerebellar testing reveals good finger-nose-finger and heel-to-shin bilaterally.  Gait and station: Station is normal.   Gait is fairly normal.  Tandem gait is mildly wide.  He does not not have retropulsion.. Romberg is negative.   Reflexes: Deep tendon reflexes are symmetric and normal bilaterally.      DIAGNOSTIC DATA (LABS, IMAGING, TESTING) - I reviewed patient records, labs, notes, testing and imaging myself where available.  Lab Results  Component Value Date   WBC 8.0 01/27/2022   HGB 13.2 01/27/2022   HCT 39.6 01/27/2022   MCV 85.0 01/27/2022   PLT 278 01/27/2022      Component Value Date/Time   NA 139 01/27/2022 1027   K 3.8 01/27/2022 1027   CL 109 01/27/2022 1027   CO2 17 (L) 01/27/2022 1027   GLUCOSE 160 (H) 01/27/2022 1027   BUN 19 01/27/2022 1027   CREATININE 1.34 (H) 01/27/2022 1027   CALCIUM 9.6 01/27/2022 1027   PROT 6.9 01/27/2022 1027   ALBUMIN 3.8 01/27/2022 1027   AST 16 01/27/2022 1027   ALT 12  01/27/2022 1027   ALKPHOS 68 01/27/2022 1027   BILITOT 0.7 01/27/2022 1027   GFRNONAA 60 (L) 01/27/2022 1027       ASSESSMENT AND PLAN  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  He has mild cognitive impairment/mild AD and is stable compared to last vsit.   I will increase the donepezil to 10 mg.  We had a conversation about the anti-amyloid monoclonal antibodies.  I am going to check an MRI of the brain to determine if he has chronic microhemorrhages to see if he is a candidate It is possible that mood issues or worsening his cognition. Try to increase his BuSpar to 7.5 mg p.o. twice daily and 15 mg at night.  If that does not help his anxiety/depression further it may be good to refer him to psychiatry.  He would like to try a oral appliance for his sleep apnea.  4.   Return in 6 months or sooner if there are new or worsening neurologic symptoms.  45-minute office visit with the majority of the time spent face-to-face for history and physical, discussion/counseling and decision-making.  Additional time with record review and documentation.   Arbadella Kimbler A. Felecia Shelling, MD, [facility] [id number], [id number] PM Certified in Neurology, Clinical Neurophysiology, Sleep Medicine and Neuroimaging  [facility] [address] [place], [place] [address] [phone]

## 2022-11-18 ENCOUNTER — Telehealth: Payer: Self-pay | Admitting: Neurology

## 2022-11-18 NOTE — Telephone Encounter (Addendum)
Referral for dentistry fax to [facility] Phone: [phone], Fax:  [phone]                                                                                                                                                                      Referral dentistry to [facility]. Phone: [phone], Fax: [phone]

## 2022-11-18 NOTE — Telephone Encounter (Signed)
[employer] NPR via [place] sent to [facility] [phone]

## 2022-12-06 ENCOUNTER — Ambulatory Visit
Admission: RE | Admit: 2022-12-06 | Discharge: 2022-12-06 | Disposition: A | Discharge status: Home / Self Care | Payer: BC Managed Care – PPO | Source: Ambulatory Visit | Attending: Neurology | Admitting: Neurology

## 2022-12-06 DIAGNOSIS — G300 Alzheimer's disease with early onset: Secondary | ICD-10-CM | POA: Diagnosis not present

## 2022-12-08 ENCOUNTER — Telehealth: Payer: Self-pay | Admitting: Neurology

## 2022-12-08 NOTE — Telephone Encounter (Signed)
The MRI of the brain showed mild generalized cortical atrophy that was most pronounced in the medial temporal lobes consistent with Alzheimer's disease.  Susceptibility weighted images did not show any hemosiderin (no chronic microhemorrhages, no siderosis).  Therefore, he could be a candidate for lecanemab  I called and left a message and will try to reach him later this week

## 2022-12-10 NOTE — Telephone Encounter (Signed)
Pt wife called. Requested a call back to go over MRI results.

## 2022-12-18 ENCOUNTER — Telehealth: Payer: Self-pay | Admitting: *Deleted

## 2022-12-18 NOTE — Telephone Encounter (Signed)
Leqembi infusion order for Leqembi 10mg /kgIV in 282ml over 1 hour every 2 weeks ongoing given to [name] in [facility].   Patient enrollment form for [employer] patient support faxed to [phone]

## 2022-12-25 NOTE — Telephone Encounter (Signed)
Forwarded message to [facility] for them to send update to intake department to further handle.

## 2022-12-25 NOTE — Telephone Encounter (Signed)
Mink from [employer] Patient Support called stating that this medication is not covered and they have faxed over a request for the Rx to be sent to see if the pt qualifies for the Patient Assistance Program.  Fax [phone]

## 2022-12-31 ENCOUNTER — Telehealth: Payer: Self-pay

## 2022-12-31 ENCOUNTER — Encounter: Payer: Self-pay | Admitting: Neurology

## 2022-12-31 NOTE — Telephone Encounter (Signed)
Called pt's wife about Doctor's note for pt so that he can have light duty at his job. Pt's wife gave information on what limitations pt can and cannot do at work. Got note typed up and read to pt as well as pt wife, getting note signed by Dr. Epimenio Foot and will be put at the front desk for Wife to pick up tomorrow 01/01/2023.

## 2023-01-05 NOTE — Telephone Encounter (Signed)
Gave fax below to [facility]:

## 2023-01-12 ENCOUNTER — Encounter: Payer: Self-pay | Admitting: Neurology

## 2023-01-13 NOTE — Telephone Encounter (Addendum)
Per [place], [facility], pt approved for [employer]. They will call to schedule medication delivery. Pt scheduled for first infusion 01/19/2023.

## 2023-01-20 NOTE — Telephone Encounter (Signed)
Dr. Myrtis Ser sent a fax today asking for the full sleep study report from 2023. I sent what I could print from [facility].

## 2023-01-21 DIAGNOSIS — Z0289 Encounter for other administrative examinations: Secondary | ICD-10-CM

## 2023-01-22 NOTE — Telephone Encounter (Signed)
Gave completed/signed form to medical records to process for pt. 

## 2023-01-24 ENCOUNTER — Other Ambulatory Visit: Payer: Self-pay | Admitting: Neurology

## 2023-01-26 NOTE — Telephone Encounter (Signed)
Last seen on 11/17/22 per note " I will increase the donepezil to 10 mg. " Follow up scheduled on 04/27/23 Last filled on 11/17/22 #45 tablets (90 day supply)

## 2023-03-10 ENCOUNTER — Other Ambulatory Visit: Payer: Self-pay | Admitting: *Deleted

## 2023-03-10 DIAGNOSIS — G300 Alzheimer's disease with early onset: Secondary | ICD-10-CM

## 2023-03-17 ENCOUNTER — Other Ambulatory Visit: Payer: Self-pay | Admitting: Neurology

## 2023-03-17 DIAGNOSIS — G309 Alzheimer's disease, unspecified: Secondary | ICD-10-CM

## 2023-03-18 ENCOUNTER — Encounter: Payer: Self-pay | Admitting: Neurology

## 2023-03-18 ENCOUNTER — Ambulatory Visit (HOSPITAL_BASED_OUTPATIENT_CLINIC_OR_DEPARTMENT_OTHER)
Admission: RE | Admit: 2023-03-18 | Discharge: 2023-03-18 | Disposition: A | Discharge status: Home / Self Care | Payer: BC Managed Care – PPO | Source: Ambulatory Visit | Attending: Neurology | Admitting: Neurology

## 2023-03-18 DIAGNOSIS — F02A0 Dementia in other diseases classified elsewhere, mild, without behavioral disturbance, psychotic disturbance, mood disturbance, and anxiety: Secondary | ICD-10-CM | POA: Insufficient documentation

## 2023-03-18 DIAGNOSIS — G300 Alzheimer's disease with early onset: Secondary | ICD-10-CM | POA: Insufficient documentation

## 2023-03-18 NOTE — Telephone Encounter (Signed)
Last seen on 11/17/22 per note " I will increase the donepezil to 10 mg " Follow up scheduled on 04/27/23 Last filled on 01/27/23 #45 tablets (90 day supply)

## 2023-03-19 ENCOUNTER — Other Ambulatory Visit: Payer: Self-pay | Admitting: *Deleted

## 2023-03-19 MED ORDER — DONEPEZIL HCL 10 MG PO TABS: 10.0000 mg | ORAL_TABLET | Freq: Every day | ORAL | 2 refills | Status: DC

## 2023-03-28 ENCOUNTER — Other Ambulatory Visit: Payer: Self-pay | Admitting: Neurology

## 2023-03-30 ENCOUNTER — Other Ambulatory Visit: Payer: Self-pay

## 2023-04-02 ENCOUNTER — Telehealth: Payer: Self-pay | Admitting: *Deleted

## 2023-04-02 NOTE — Telephone Encounter (Signed)
Patient needs MRI brain w/wo contrast, Dr. Epimenio Foot placed the order back in June. It needs to be scheduled between 04/03/23-04/15/23.

## 2023-04-06 NOTE — Telephone Encounter (Signed)
Mri scheduled on 04/13/23 at [facility]

## 2023-04-06 NOTE — Telephone Encounter (Signed)
Called and LVM with this information on both his and his wife's VM. Ok per [employer].

## 2023-04-06 NOTE — Telephone Encounter (Signed)
See messages below.  

## 2023-04-13 ENCOUNTER — Ambulatory Visit (HOSPITAL_COMMUNITY)
Admission: RE | Admit: 2023-04-13 | Discharge: 2023-04-13 | Disposition: A | Discharge status: Home / Self Care | Payer: BC Managed Care – PPO | Source: Ambulatory Visit | Attending: Neurology | Admitting: Neurology

## 2023-04-13 DIAGNOSIS — G309 Alzheimer's disease, unspecified: Secondary | ICD-10-CM | POA: Diagnosis not present

## 2023-04-14 ENCOUNTER — Encounter: Payer: Self-pay | Admitting: Neurology

## 2023-04-15 ENCOUNTER — Telehealth: Payer: Self-pay | Admitting: Neurology

## 2023-04-15 NOTE — Telephone Encounter (Signed)
Called pt lLVM to please call office t reschedule appointment.

## 2023-04-15 NOTE — Telephone Encounter (Signed)
Pt wife following up on  MRI, stated pt is scheduled for infusion treatment for tomorrow and they have to have results before treatment.

## 2023-04-20 ENCOUNTER — Encounter: Payer: Self-pay | Admitting: Neurology

## 2023-04-27 ENCOUNTER — Ambulatory Visit: Payer: BC Managed Care – PPO | Admitting: Neurology

## 2023-04-28 ENCOUNTER — Telehealth: Payer: Self-pay | Admitting: *Deleted

## 2023-04-28 NOTE — Telephone Encounter (Signed)
Called pt left voicemail

## 2023-05-05 ENCOUNTER — Telehealth: Payer: Self-pay | Admitting: *Deleted

## 2023-05-05 NOTE — Telephone Encounter (Signed)
[name] from faxed today to [phone]

## 2023-05-27 ENCOUNTER — Encounter: Payer: Self-pay | Admitting: Neurology

## 2023-05-28 ENCOUNTER — Telehealth: Payer: Self-pay | Admitting: Neurology

## 2023-05-28 NOTE — Telephone Encounter (Signed)
Referral for Dentistry resending from referral entered 11/18/22 to [facility]. Phone: [phone], Fax: [phone].

## 2023-06-01 NOTE — Telephone Encounter (Signed)
Myrtis Ser sent a fax asking for sleep study results. I confirmed with Irving Burton that he hasn't had one ordered and let the office know.

## 2023-06-19 ENCOUNTER — Telehealth: Payer: Self-pay | Admitting: Neurology

## 2023-06-19 NOTE — Telephone Encounter (Signed)
Pt's wife calling to check on status of disability forms send over from [place]. Pt is transferring over to long term disability and [employer] need forms filled out by neurologist and faxed back. Would like a call from the nurse.

## 2023-06-22 ENCOUNTER — Other Ambulatory Visit: Payer: Self-pay | Admitting: *Deleted

## 2023-06-22 DIAGNOSIS — G309 Alzheimer's disease, unspecified: Secondary | ICD-10-CM

## 2023-06-22 NOTE — Telephone Encounter (Signed)
They also sent mychart message re this. Mychart sent to Candi Leash to see if forms have been received.

## 2023-06-23 ENCOUNTER — Telehealth: Payer: Self-pay | Admitting: Neurology

## 2023-06-23 ENCOUNTER — Telehealth: Payer: Self-pay | Admitting: *Deleted

## 2023-06-23 DIAGNOSIS — Z0289 Encounter for other administrative examinations: Secondary | ICD-10-CM

## 2023-06-23 NOTE — Telephone Encounter (Signed)
BCBS no auth requied sent to [facility] [phone]

## 2023-06-23 NOTE — Telephone Encounter (Signed)
I called pt to let him know that we have not received [employer] form.

## 2023-06-24 ENCOUNTER — Encounter: Payer: Self-pay | Admitting: Neurology

## 2023-06-29 ENCOUNTER — Telehealth: Payer: Self-pay | Admitting: Neurology

## 2023-06-29 NOTE — Telephone Encounter (Signed)
Pt's wife, Jaleil Renwick said when pt lies down room is spiinning. Has been occurring  for 6 months. Went to PCP, PCP said there no vertigo possibly neurological may be related to Alzheimer's diagnosis. Would like a call back.

## 2023-06-30 ENCOUNTER — Other Ambulatory Visit: Payer: Self-pay | Admitting: Neurology

## 2023-06-30 DIAGNOSIS — G2581 Restless legs syndrome: Secondary | ICD-10-CM

## 2023-06-30 DIAGNOSIS — D649 Anemia, unspecified: Secondary | ICD-10-CM

## 2023-06-30 MED ORDER — GABAPENTIN 100 MG PO CAPS: ORAL_CAPSULE | ORAL | 5 refills | Status: AC

## 2023-06-30 MED ORDER — ROPINIROLE HCL 0.5 MG PO TABS: ORAL_TABLET | ORAL | 11 refills | Status: DC

## 2023-06-30 MED ORDER — GABAPENTIN 100 MG PO CAPS: 100.0000 mg | ORAL_CAPSULE | Freq: Three times a day (TID) | ORAL | 5 refills | Status: DC

## 2023-07-06 ENCOUNTER — Other Ambulatory Visit: Payer: Self-pay

## 2023-07-06 DIAGNOSIS — D649 Anemia, unspecified: Secondary | ICD-10-CM

## 2023-07-06 DIAGNOSIS — G2581 Restless legs syndrome: Secondary | ICD-10-CM

## 2023-07-07 LAB — FERRITIN: Ferritin: 13 ng/mL — ABNORMAL LOW (ref 30–400)

## 2023-07-19 ENCOUNTER — Other Ambulatory Visit: Payer: BC Managed Care – PPO

## 2023-07-31 ENCOUNTER — Ambulatory Visit
Admission: RE | Admit: 2023-07-31 | Discharge: 2023-07-31 | Disposition: A | Discharge status: Home / Self Care | Payer: BC Managed Care – PPO | Source: Ambulatory Visit | Attending: Neurology | Admitting: Neurology

## 2023-07-31 DIAGNOSIS — G309 Alzheimer's disease, unspecified: Secondary | ICD-10-CM

## 2023-08-03 ENCOUNTER — Encounter: Payer: Self-pay | Admitting: *Deleted

## 2023-08-06 ENCOUNTER — Encounter: Payer: Self-pay | Admitting: Neurology

## 2023-08-06 ENCOUNTER — Ambulatory Visit: Payer: BC Managed Care – PPO | Admitting: Neurology

## 2023-08-06 VITALS — BP 133/68 | HR 76 | Ht 67.0 in | Wt 205.5 lb

## 2023-08-06 DIAGNOSIS — G2581 Restless legs syndrome: Secondary | ICD-10-CM

## 2023-08-06 DIAGNOSIS — G4733 Obstructive sleep apnea (adult) (pediatric): Secondary | ICD-10-CM | POA: Diagnosis not present

## 2023-08-06 DIAGNOSIS — G309 Alzheimer's disease, unspecified: Secondary | ICD-10-CM | POA: Diagnosis not present

## 2023-08-06 DIAGNOSIS — F418 Other specified anxiety disorders: Secondary | ICD-10-CM | POA: Diagnosis not present

## 2023-08-06 DIAGNOSIS — F0280 Dementia in other diseases classified elsewhere without behavioral disturbance: Secondary | ICD-10-CM

## 2023-08-06 NOTE — Progress Notes (Signed)
[facility]  PATIENT: [name] DOB: May 06, 1956  REFERRING DOCTOR OR PCP: Burnett Kanaris, PA-C SOURCE: Patient, notes from primary care  _________________________________   HISTORICAL  CHIEF COMPLAINT:  Chief Complaint  Patient presents with   Follow-up    Pt in room 10, wife and daughter in room. Here for Alzheimer follow up on Lequembi. Daughter and wife said memory varies short memory. MMSE:20    HISTORY OF PRESENT ILLNESS:  [name] is a 67 y.o. man with memory concerns.  Update 08/06/2023: He feels his memory is doing better since starting Lequembi.   He is tolerating it well.    He recalls things slightly better.    MMSE has been stable.      He has started Ozempic and feels more drained.  Appetite is reduced and he has lost 20 pounds.  He is on donepezil and he is tolerating it well.      Memory issues and cognitive issues were better after treating the anxiety with BuSpar though he continues to have some difficulties with executive function type tasks and memory.  His mood is doing better.   He still has some anxiety/depression.  Anxiety is worse when in a car.   Buspar makes hi sleepy so he just takes once a day.   .  Previously,  [name] was not well tolerated.   He is on Celexa 40 mg daily and buspirone 15 mg po qHS.     Physically he is doing well.  He denies gait issues though balance is sometimes off..   No numbness or weakness though his hands feel stiff.    He snores and has witnessed OSA.   He had a home sleep study 05/20/2022 showing moderate OSA and CPAP was initiated.   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   We discussed an oral appliance.   We also discussed the Inspire device (has AHI=28).     He does not have the best diet.  He eats a lot of processed food and sugar.  We  discussed try to eat better.  He is socially and physically active and I advised him to continue to be so  He has HTN, NIDDM, hyperlipidemia,   He had prostate cancer and had chemo.  Memory issues seem to worsen after his chemotherapy  His grandmother and 2 grand-aunts had AD  Memory history: He has had difficulty with short term memory since diagnosed with prostate cancer in 2022.  He felt that he worsened after his initial Lupron in 2022 and more issues after his second round early 2023.      He feels his cognitive issues are only memory related and he denies difficulty with visual-spatial tasks, executive function or language.  When he has difficulty remembering something, he usually will recall with hints.     He has no difficulty doing chores, even complex engine work on a car.     His wife noted he was more  anxious and depressed since the prostate cancer issues.   He has been very tearful at times   He is apathetic.  He  has been on citalopram 20 mg po daily x one year and buspirone 5 mg po qd (was bid but became sleepy).   .   He also has had more depression since  ED issues related to the PSA and low testosterone.        Viagra caused HA and is reluctant to do injections for ED.      At his visit with primary care in mid 2023, he had an MMSE was 21/30.  In September 2023,  he scored 10/30 on the MoCA in our office.      B12 and TSH were normal in 2023.    He was on chemotherapy for prostate cancer and is concerned      08/06/2023    3:50 PM 11/17/2022    3:34 PM 10/27/2022    9:02 AM  MMSE - Mini Mental State Exam  Orientation to time 2 3 4   Orientation to Place 5 5 5   Registration 3 3 3   Attention/ Calculation 0 0 0  Recall 3 3 3   Language- name 2 objects 2 2 2   Language- repeat 1 1 1   Language- follow 3 step command 3 3 2   Language- read & follow direction 1 1 1   Write a sentence 0 1 1  Copy design 0 0 0  Total score 20 22 22       Imaging personally reviewed: CT head  01/23/2022 showed mild atrophy and no acute findings.   Some calcification in left vertebral artery.    Amyloid-PET 11/14/2022 was consistent with Alzheimer's (positive)    Laboratory results: 04/22/2022 lipid panel showed very elevated triglycerides of 464 but normal cholesterol.  CMP showed elevated glucose and elevated creatinine (1.6) the estimated GFR was 47.  Hemoglobin A1c was 7.7 mild anemia with hemoglobin 11.7.  02/24/2022 testosterone level less than 20 02/20/2022: TSH was normal  REVIEW OF SYSTEMS: Constitutional: No fevers, chills, sweats, or change in appetite Eyes: No visual changes, double vision, eye pain Ear, nose and throat: No hearing loss, ear pain, nasal congestion, sore throat Cardiovascular: No chest pain, palpitations Respiratory:  No shortness of breath at rest or with exertion.   No wheezes GastrointestinaI: No nausea, vomiting, diarrhea, abdominal pain, fecal incontinence Genitourinary: He has had prostate surgery and initially had retention Musculoskeletal:  No neck pain, back pain Integumentary: No rash, pruritus, skin lesions Neurological: as above Psychiatric: No depression at this time.  He has anxiety  endocrine: No palpitations, diaphoresis, change in appetite, change in weigh or increased thirst Hematologic/Lymphatic:  No anemia, purpura, petechiae. Allergic/Immunologic: No itchy/runny eyes, nasal congestion, recent allergic reactions, rashes  ALLERGIES: Allergies  Allergen Reactions   Codeine     Syncopal Episode Patient reports he CAN take "CODONES" WITHOUT any problems   Lisinopril Cough         HOME MEDICATIONS:  Current Outpatient Medications:    aspirin EC 81 MG tablet, Take 81 mg by mouth daily. Swallow whole., Disp: , Rfl:    busPIRone (BUSPAR) 15 MG tablet, Take 1 tablet (15 mg total) by mouth 2 (two) times daily., Disp: 60 tablet, Rfl: 11   citalopram (CELEXA) 40 MG tablet, TAKE 1 TABLET BY MOUTH EVERY DAY, Disp: 90 tablet, Rfl: 2    cyanocobalamin (VITAMIN B12) 1000 MCG tablet, Take 1 tablet by mouth daily., Disp: , Rfl:    Diclofenac Sodium CR 100 MG 24 hr tablet, Take 100 mg by mouth daily., Disp: , Rfl:    donepezil (ARICEPT) 10 MG tablet, Take 1 tablet (10 mg total) by mouth at bedtime., Disp: 90 tablet, Rfl: 2   esomeprazole (NEXIUM) 40 MG capsule, Take 40 mg by mouth daily at 12 noon., Disp: , Rfl:  ferrous sulfate 325 (65 FE) MG tablet, Take 325 mg by mouth daily with breakfast., Disp: , Rfl:    furosemide (LASIX) 40 MG tablet, Take 20 mg by mouth daily., Disp: , Rfl:    gabapentin (NEURONTIN) 100 MG capsule, Take one to three po qHS, Disp: 90 capsule, Rfl: 5   insulin lispro (HUMALOG) 200 UNIT/ML KwikPen, Use 3 times daily as directed. Max daily dose is 100 units., Disp: , Rfl:    metFORMIN (GLUCOPHAGE) 1000 MG tablet, Take 1,000 mg by mouth 2 (two) times daily with a meal., Disp: , Rfl:    Multiple Vitamin (MULTIVITAMIN ADULT PO), Take by mouth., Disp: , Rfl:    olmesartan (BENICAR) 20 MG tablet, Take 20 mg by mouth daily., Disp: , Rfl:    rosuvastatin (CRESTOR) 40 MG tablet, Take 40 mg by mouth daily., Disp: , Rfl:    tamsulosin (FLOMAX) 0.4 MG CAPS capsule, TAKE 1 CAPSULE BY MOUTH DAILY 1 HOUR AFTER THE LAST MEAL OF THE DAY, Disp: , Rfl:    ARIPiprazole (ABILIFY) 5 MG tablet, TAKE 1 TABLET (5 MG TOTAL) BY MOUTH DAILY. (Patient not taking: Reported on 08/06/2023), Disp: 90 tablet, Rfl: 2   insulin glargine (LANTUS) 100 UNIT/ML Solostar Pen, Inject 40 Units into the skin at bedtime. (Patient not taking: Reported on 08/06/2023), Disp: , Rfl:    levofloxacin (LEVAQUIN) 500 MG tablet, Take 1 tablet (500 mg total) by mouth daily. (Patient not taking: Reported on 11/17/2022), Disp: 7 tablet, Rfl: 1   megestrol (MEGACE) 20 MG tablet, Take 20 mg by mouth daily. (Patient not taking: Reported on 11/17/2022), Disp: , Rfl:    ondansetron (ZOFRAN) 4 MG tablet, Take by mouth. (Patient not taking: Reported on 08/06/2023), Disp: ,  Rfl:    OZEMPIC, 1 MG/DOSE, 4 MG/3ML SOPN, 1 mg., Disp: , Rfl:    rOPINIRole (REQUIP) 0.5 MG tablet, One or two po qHS (Patient not taking: Reported on 08/06/2023), Disp: 60 tablet, Rfl: 11   Vitamin D, Ergocalciferol, (DRISDOL) 1.25 MG (50000 UNIT) CAPS capsule, Take 1 capsule (50,000 Units total) by mouth every 7 (seven) days. (Patient not taking: Reported on 08/06/2023), Disp: 13 capsule, Rfl: 1  PAST MEDICAL HISTORY: Past Medical History:  Diagnosis Date   Diabetes mellitus without complication (HCC)    GERD (gastroesophageal reflux disease)    PONV (postoperative nausea and vomiting)    Prostate cancer (HCC)     PAST SURGICAL HISTORY: Past Surgical History:  Procedure Laterality Date   HERNIA REPAIR     groin as infant   NOSE SURGERY     PROSTATE BIOPSY N/A 02/28/2021   Procedure: PROSTATE BIOPSY URONAV PROSTATE;  Surgeon: Orson Ape, MD;  Location: [facility] ORS;  Service: Urology;  Laterality: N/A;   TOENAIL EXCISION      FAMILY HISTORY: No family history on file.  SOCIAL HISTORY:  Social History   Socioeconomic History   Marital status: Married    Spouse name: Not on file   Number of children: Not on file   Years of education: Not on file   Highest education level: Not on file  Occupational History   Not on file  Tobacco Use   Smoking status: Never   Smokeless tobacco: Never  Vaping Use   Vaping status: Never Used  Substance and Sexual Activity   Alcohol use: Not Currently   Drug use: Never   Sexual activity: Not on file  Other Topics Concern   Not on file  Social History Narrative  Not on file   Social Determinants of Health   Financial Resource Strain: Not on file  Food Insecurity: Low Risk  (03/24/2023)   [facility]   Hunger Vital Sign    Worried About Running Out of Food in the Last Year: Never true    Ran Out of Food in the Last Year: Never true  Transportation Needs: Unmet Transportation Needs (03/24/2023)    [facility]   Transportation    In the past 12 months, has lack of reliable transportation kept you from medical appointments, meetings, work or from getting things needed for daily living? : Yes  Physical Activity: Not on file  Stress: Not on file  Social Connections: Not on file  Intimate Partner Violence: Not on file     PHYSICAL EXAM  Vitals:   08/06/23 1537  BP: 133/68  Pulse: 76  Weight: 205 lb 8 oz (93.2 kg)  Height: 5\' 7"  (1.702 m)    Body mass index is 32.19 kg/m.   General: The patient is well-developed and well-nourished and in no acute distress  HEENT:  Head is [place]/AT.  Sclera are anicteric.     Skin: Extremities are without rash or  edema.  Musculoskeletal:  Back is nontender  Neurologic Exam  Mental status: He scored 22/30 on the [facility] cognitive assessment (mild cognitive impairment) speech is normal.  Cranial nerves: Extraocular movements are full.  Facial strength and sensation was normal.  No obvious hearing deficits are noted.  Motor: Slight intention tremor in hands.  Muscle bulk is normal.  No cogwheeling.  Tone is normal. Strength is  5 / 5 in all 4 extremities.   Sensory: Sensory testing is intact to pinprick, soft touch and vibration sensation in all 4 extremities.  Coordination: Cerebellar testing reveals good finger-nose-finger and heel-to-shin bilaterally.  Gait and station: Station is normal.   Gait is fairly normal.  Tandem gait is mildly wide.  He does not not have retropulsion.. Romberg is negative.   Reflexes: Deep tendon reflexes are symmetric and normal bilaterally.      DIAGNOSTIC DATA (LABS, IMAGING, TESTING) - I reviewed patient records, labs, notes, testing and imaging myself where available.  Lab Results  Component Value Date   WBC 8.0 01/27/2022   HGB 13.2 01/27/2022   HCT 39.6 01/27/2022   MCV 85.0 01/27/2022   PLT 278 01/27/2022      Component Value Date/Time   NA 139 01/27/2022  1027   K 3.8 01/27/2022 1027   CL 109 01/27/2022 1027   CO2 17 (L) 01/27/2022 1027   GLUCOSE 160 (H) 01/27/2022 1027   BUN 19 01/27/2022 1027   CREATININE 1.34 (H) 01/27/2022 1027   CALCIUM 9.6 01/27/2022 1027   PROT 6.9 01/27/2022 1027   ALBUMIN 3.8 01/27/2022 1027   AST 16 01/27/2022 1027   ALT 12 01/27/2022 1027   ALKPHOS 68 01/27/2022 1027   BILITOT 0.7 01/27/2022 1027   GFRNONAA 60 (L) 01/27/2022 1027       ASSESSMENT AND PLAN  Alzheimer disease (HCC)  RLS (restless legs syndrome)  Depression with anxiety  OSA (obstructive sleep apnea)   Continue lecanemab for AD   Last MRI was fine.     We also discussed trying to be socially, cognitively, and   physically active  Try to increase his BuSpar to 7.5 mg p.o. qAM nd 15 mg at night.    Return in 6 months or sooner if there  are new or worsening neurologic symptoms.  This visit is part of a comprehensive longitudinal care medical relationship regarding the patients primary diagnosis of Alzheimer's disease and related concerns.   42-minute office visit with the majority of the time spent face-to-face for history and physical, discussion/counseling and decision-making.  Additional time with record review and documentation.   Maris Bena A. Epimenio Foot, MD, [facility] 08/06/2023, 4:38 PM Certified in Neurology, Clinical Neurophysiology, Sleep Medicine and Neuroimaging  [facility] [address], [address] [place], [place] [address] [phone]

## 2023-08-06 NOTE — Patient Instructions (Signed)
Chang the buspirone to 1/2 pill in the morning and 1/2 pill at night.  After 3 or 4 weeks if [name] is tolerating this dose well, increase to 1/2 pill in the morning and 1 at night.

## 2023-08-12 ENCOUNTER — Telehealth: Payer: Self-pay | Admitting: *Deleted

## 2023-08-12 NOTE — Telephone Encounter (Signed)
Called and spoke with wife. Explained [employer] pt support sent fax that his pt assistance for Lequembi will end 09/22/23. He needs to re-enroll. He needs to sign three pages. They are currently in [place]. I emailed forms to her at [email or web address]. She will f/u next week to get signed forms back to [place].

## 2023-08-17 ENCOUNTER — Telehealth: Payer: Self-pay | Admitting: *Deleted

## 2023-08-17 NOTE — Telephone Encounter (Signed)
  Per 07/31/23 MRI " Please let him or his wife know that the MRI of the brain looked okay it did not show any hemorrhages.  The atrophy was stable.  Please also let the infusion center know that he can continue to [place] "

## 2023-08-18 ENCOUNTER — Encounter: Payer: Self-pay | Admitting: *Deleted

## 2023-08-18 ENCOUNTER — Other Ambulatory Visit: Payer: Self-pay | Admitting: Neurology

## 2023-08-18 DIAGNOSIS — F0280 Dementia in other diseases classified elsewhere without behavioral disturbance: Secondary | ICD-10-CM

## 2023-08-18 NOTE — Telephone Encounter (Signed)
Dr. Chip Boer in [place] said patient will need to have the follow lab before she can submitted his Leqembi. Can you please place the below order and I will call the patient and left him know this test is needed.

## 2023-08-18 NOTE — Telephone Encounter (Signed)
My chart message sent to patient regarding the need to blood work

## 2023-08-19 ENCOUNTER — Other Ambulatory Visit (INDEPENDENT_AMBULATORY_CARE_PROVIDER_SITE_OTHER): Payer: Self-pay

## 2023-08-19 DIAGNOSIS — Z0289 Encounter for other administrative examinations: Secondary | ICD-10-CM

## 2023-08-19 DIAGNOSIS — F0280 Dementia in other diseases classified elsewhere without behavioral disturbance: Secondary | ICD-10-CM

## 2023-08-24 ENCOUNTER — Encounter: Payer: Self-pay | Admitting: Neurology

## 2023-08-25 NOTE — Telephone Encounter (Signed)
Patient came on 08/19/23 for blood test.

## 2023-08-31 NOTE — Telephone Encounter (Signed)
Emailed form to pt's wife at [email or web address] on 08/31/2023.

## 2023-09-01 NOTE — Telephone Encounter (Signed)
Spoke w/ [facility] and Dr. Epimenio Foot. They are going to speak with management to see if pt can be infused without lab result. Will double check to see if insurance is requiring or not. Will let us know

## 2023-09-03 ENCOUNTER — Encounter: Payer: Self-pay | Admitting: Neurology

## 2023-09-03 NOTE — Telephone Encounter (Signed)
[employer] [place]) calling verify procedure code for infusion  and if authorization has been sent over. Can contact Provider Inquiry Services [phone] with that information.

## 2023-09-03 NOTE — Telephone Encounter (Signed)
I have given this message from [facility] to [place] w/ [facility].

## 2023-09-04 LAB — APOE ALZHEIMER'S RISK

## 2023-09-07 ENCOUNTER — Encounter: Payer: Self-pay | Admitting: *Deleted

## 2023-09-08 ENCOUNTER — Telehealth: Payer: Self-pay | Admitting: *Deleted

## 2023-09-08 NOTE — Telephone Encounter (Signed)
Received fax that [employer] patient support [phone], and fax [phone] benefits wll be verified at the start of the calendar year, and EPS will f/u to discuss benefit verification results and appropriate next steps.  [employer] pt support pharmacy fax [phone].

## 2023-10-13 ENCOUNTER — Telehealth: Payer: Self-pay | Admitting: *Deleted

## 2023-10-13 NOTE — Telephone Encounter (Signed)
Received message from [facility]: "[name] 1956/02/15 Needs a New Script and list of current Meds for his Free Leqembi [phone] is the contact number for [employer] patient support "  I called this number/[employer] Patient [employer] and spoke with Misty Stanley. She said pt navigator on the phone but showing they have 9 refills left from last prescription. They will move case forward to evaluate for pt assistance program.  She confirmed they do need updated med list, no meds showing. Fax#[phone] Shernetta/pt navigator. I faxed this and received confirmation.

## 2023-10-14 ENCOUNTER — Encounter: Payer: Self-pay | Admitting: Neurology

## 2023-10-14 ENCOUNTER — Other Ambulatory Visit: Payer: Self-pay | Admitting: Neurology

## 2023-10-14 MED ORDER — HYDROXYZINE HCL 10 MG PO TABS: ORAL_TABLET | ORAL | 5 refills | Status: DC

## 2023-11-03 ENCOUNTER — Other Ambulatory Visit: Payer: Self-pay | Admitting: Neurology

## 2023-11-03 DIAGNOSIS — F0280 Dementia in other diseases classified elsewhere without behavioral disturbance: Secondary | ICD-10-CM

## 2023-11-04 ENCOUNTER — Telehealth: Payer: Self-pay

## 2023-11-04 NOTE — Telephone Encounter (Signed)
Faxed transfer of care referral to [facility] (P) [phone].  Faxed as STAT as they are scheduling 6 months out

## 2023-11-05 ENCOUNTER — Other Ambulatory Visit: Payer: Self-pay | Admitting: Neurology

## 2023-11-05 NOTE — Telephone Encounter (Signed)
Rx sent for 90 day supply.

## 2023-11-18 ENCOUNTER — Telehealth: Payer: Self-pay | Admitting: *Deleted

## 2023-11-18 NOTE — Telephone Encounter (Signed)
 Wife sent message: "Loletta [facility] called they wanted me to call and see received paperwork that needs to be updated on Akil's time disability? Thank you"

## 2023-11-21 ENCOUNTER — Other Ambulatory Visit: Payer: Self-pay | Admitting: Neurology

## 2023-11-25 NOTE — Telephone Encounter (Signed)
 Patient paid form fee, placed in POD 1 for review

## 2023-11-26 DIAGNOSIS — Z0289 Encounter for other administrative examinations: Secondary | ICD-10-CM

## 2023-12-01 ENCOUNTER — Telehealth: Payer: Self-pay | Admitting: *Deleted

## 2023-12-01 NOTE — Telephone Encounter (Signed)
 Pt [place] form faxed on 12/01/2023

## 2023-12-14 ENCOUNTER — Other Ambulatory Visit: Payer: Self-pay | Admitting: Neurology

## 2023-12-15 NOTE — Telephone Encounter (Signed)
 Last seen on 08/06/23 Follow up scheduled on 02/18/24

## 2023-12-16 ENCOUNTER — Other Ambulatory Visit: Payer: Self-pay | Admitting: Neurology

## 2024-01-03 ENCOUNTER — Encounter: Payer: Self-pay | Admitting: Neurology

## 2024-01-06 ENCOUNTER — Telehealth: Payer: Self-pay | Admitting: Neurology

## 2024-01-06 NOTE — Telephone Encounter (Signed)
 [employer]/ Shernette requesting refill for [employer].  Patient received free medication in order to ship medication we need a new prescription for infusion. Fax: [phone]

## 2024-01-06 NOTE — Telephone Encounter (Signed)
 Spoke w/ Holly/[facility] who states they called today trying to schedule delivery of drug. I called back and spoke with Ike Malady.  Requested new prescription. Transferred me to pharmacist, [place]. Provided VO for:    Nothing further needed. [facility] notified that rx called in.

## 2024-02-18 ENCOUNTER — Ambulatory Visit: Payer: BC Managed Care – PPO | Admitting: Neurology

## 2024-02-18 ENCOUNTER — Encounter: Payer: Self-pay | Admitting: Neurology

## 2024-02-18 VITALS — BP 139/74 | HR 75 | Ht 67.0 in | Wt 195.0 lb

## 2024-02-18 DIAGNOSIS — F418 Other specified anxiety disorders: Secondary | ICD-10-CM

## 2024-02-18 DIAGNOSIS — G4733 Obstructive sleep apnea (adult) (pediatric): Secondary | ICD-10-CM

## 2024-02-18 DIAGNOSIS — G2581 Restless legs syndrome: Secondary | ICD-10-CM | POA: Diagnosis not present

## 2024-02-18 DIAGNOSIS — E611 Iron deficiency: Secondary | ICD-10-CM

## 2024-02-18 DIAGNOSIS — G309 Alzheimer's disease, unspecified: Secondary | ICD-10-CM | POA: Diagnosis not present

## 2024-02-18 DIAGNOSIS — E559 Vitamin D deficiency, unspecified: Secondary | ICD-10-CM

## 2024-02-18 DIAGNOSIS — F0280 Dementia in other diseases classified elsewhere without behavioral disturbance: Secondary | ICD-10-CM

## 2024-02-18 MED ORDER — ROPINIROLE HCL 1 MG PO TABS: ORAL_TABLET | ORAL | 3 refills | Status: AC

## 2024-02-18 NOTE — Progress Notes (Addendum)
 [facility]  PATIENT: [name] DOB: [birth year]  REFERRING DOCTOR OR PCP: Virginia  Fulbright, PA-C SOURCE: Patient, notes from primary care  _________________________________   HISTORICAL  CHIEF COMPLAINT:  Chief Complaint  Patient presents with   RM10/MEMORY    Pt is here with his Wife. Pt's wife states that the pt has been doing better. Pt's wife states that pt's RLS is getting worse at night.     HISTORY OF PRESENT ILLNESS:  [name] is a 68 y.o. man with memory concerns.  Update 02/18/2024: RLS is worse and he constantly fidgets at night.  He is on ropinirole  1 mg nightly.  He stopped gabapentin  (forst seemed to help but then not).   He notes dreams are vivid and often work-related.  This is better since donepezil  moved to the morning - had active RBD dreams.    He snores and has witnessed OSA.   He had a home sleep study 05/20/2022 showing moderate OSA and CPAP was initiated.   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 Not interested in an oral appliance.   We also discussed the Inspire device (has AHI=28).   He has started Ozempic and appetite is reduced and he has lost 20 pounds.  Sugars are better.  Wife notes snoring is better  He feels his memory is doing better since starting Lequembi.   MMSE has been stable.  He is tolerating it well.    He recalls things slightly better.  Notes no progression of cognitive issues.   He is on donepezil  and he is tolerating it well.     Mood is doing better.  Currently on citalopram   He stopped Buspar .,  Abilfy was not well tolerated.      Physically he is doing well.  He denies gait issues though balance is sometimes off..   No numbness or weakness though his hands feel stiff.    We discussed trying to eat better.  He has made some changes but still eats a lot of  processed mood.   He is socially and physically active and I advised him to continue to be so  He has HTN, NIDDM, hyperlipidemia,  He had prostate cancer and had chemo.  Memory issues seem to worsen after his chemotherapy  His grandmother and 2 grand-aunts had Alzheimers Disease  Memory history: He has had difficulty with short term memory since diagnosed with prostate cancer in 2022.  He felt that he worsened after his initial Lupron in 2022 and more issues after his second round early 2023.      He feels his cognitive issues are only memory related and he denies difficulty with visual-spatial tasks, executive function or language.  When he has difficulty remembering something, he usually will recall with hints.     He has no difficulty doing chores, even complex engine work on a car.     His wife noted he was more  anxious and depressed since the prostate cancer issues.   He has been very tearful at times   He is apathetic.  He  has been on citalopram  20 mg po daily x one year and buspirone  5 mg po qd (was bid but became sleepy).   .   He also has had more depression since ED issues related to the PSA and low  testosterone.        Viagra caused HA and is reluctant to do injections for ED.      At his visit with primary care in mid 2023, he had an MMSE was 66/30.  In September 2023,  he scored 10/30 on the MoCA in our office.      B12 and TSH were normal in 2023.    He was on chemotherapy for prostate cancer and is concerned      02/18/2024    3:34 PM 08/06/2023    3:50 PM 11/17/2022    3:34 PM  MMSE - Mini Mental State Exam  Orientation to time 5 2 3   Orientation to Place 5 5 5   Registration 3 3 3   Attention/ Calculation 1 0 0  Recall 3 3 3   Language- name 2 objects 2 2 2   Language- repeat 0 1 1  Language- follow 3 step command 3 3 3   Language- read & follow direction 1 1 1   Write a sentence 1 0 1  Copy design 0 0 0  Total score 24 20 22       Imaging personally reviewed: CT head  01/23/2022 showed mild atrophy and no acute findings.   Some calcification in left vertebral artery.    Amyloid-PET 11/14/2022 was consistent with Alzheimer's (positive)    Laboratory results: 04/22/2022 lipid panel showed very elevated triglycerides of 464 but normal cholesterol.  CMP showed elevated glucose and elevated creatinine (1.6) the estimated GFR was 47.  Hemoglobin A1c was 7.7 mild anemia with hemoglobin 11.7.  02/24/2022 testosterone level less than 20 02/20/2022: TSH was normal  REVIEW OF SYSTEMS: Constitutional: No fevers, chills, sweats, or change in appetite Eyes: No visual changes, double vision, eye pain Ear, nose and throat: No hearing loss, ear pain, nasal congestion, sore throat Cardiovascular: No chest pain, palpitations Respiratory:  No shortness of breath at rest or with exertion.   No wheezes GastrointestinaI: No nausea, vomiting, diarrhea, abdominal pain, fecal incontinence Genitourinary: He has had prostate surgery and initially had retention Musculoskeletal:  No neck pain, back pain Integumentary: No rash, pruritus, skin lesions Neurological: as above Psychiatric: No depression at this time.  He has anxiety  endocrine: No palpitations, diaphoresis, change in appetite, change in weigh or increased thirst Hematologic/Lymphatic:  No anemia, purpura, petechiae. Allergic/Immunologic: No itchy/runny eyes, nasal congestion, recent allergic reactions, rashes  ALLERGIES: Allergies  Allergen Reactions   Codeine     Syncopal Episode Patient reports he CAN take "CODONES" WITHOUT any problems   Lisinopril Cough         HOME MEDICATIONS:  Current Outpatient Medications:    ARIPiprazole  (ABILIFY ) 5 MG tablet, TAKE 1 TABLET (5 MG TOTAL) BY MOUTH DAILY., Disp: 90 tablet, Rfl: 2   aspirin EC 81 MG tablet, Take 81 mg by mouth daily. Swallow whole., Disp: , Rfl:    citalopram  (CELEXA ) 40 MG tablet, TAKE 1 TABLET BY MOUTH EVERY DAY, Disp: 90 tablet, Rfl: 2   cyanocobalamin  (VITAMIN B12) 1000 MCG tablet, Take 1 tablet by mouth daily., Disp: , Rfl:    Diclofenac Sodium CR 100 MG 24 hr tablet, Take 100 mg by mouth daily., Disp: , Rfl:    donepezil  (ARICEPT ) 10 MG tablet, Take 1 tablet (10 mg total) by mouth at bedtime., Disp: 30 tablet, Rfl: 1   esomeprazole (NEXIUM) 40 MG capsule, Take 40 mg by mouth daily at 12 noon., Disp: , Rfl:    ferrous sulfate 325 (65 FE) MG tablet, Take  325 mg by mouth daily with breakfast., Disp: , Rfl:    furosemide (LASIX) 40 MG tablet, Take 20 mg by mouth daily., Disp: , Rfl:    gabapentin  (NEURONTIN ) 100 MG capsule, Take one to three po qHS, Disp: 90 capsule, Rfl: 5   hydrOXYzine  (ATARAX ) 10 MG tablet, TAKE 1 TABLET BY MOUTH DAILY AS NEEDED FOR ANXIETY. NO MORE THAN 3 TABS PER DAY, Disp: 270 tablet, Rfl: 1   insulin lispro (HUMALOG) 200 UNIT/ML KwikPen, Use 3 times daily as directed. Max daily dose is 100 units., Disp: , Rfl:    metFORMIN (GLUCOPHAGE) 1000 MG tablet, Take 1,000 mg by mouth 2 (two) times daily with a meal., Disp: , Rfl:    Multiple Vitamin (MULTIVITAMIN ADULT PO), Take by mouth., Disp: , Rfl:    olmesartan (BENICAR) 20 MG tablet, Take 20 mg by mouth daily., Disp: , Rfl:    OZEMPIC, 1 MG/DOSE, 4 MG/3ML SOPN, 1 mg., Disp: , Rfl:    rosuvastatin (CRESTOR) 40 MG tablet, Take 40 mg by mouth daily., Disp: , Rfl:    tamsulosin (FLOMAX) 0.4 MG CAPS capsule, TAKE 1 CAPSULE BY MOUTH DAILY 1 HOUR AFTER THE LAST MEAL OF THE DAY, Disp: , Rfl:    insulin glargine (LANTUS) 100 UNIT/ML Solostar Pen, Inject 40 Units into the skin at bedtime. (Patient not taking: Reported on 08/06/2023), Disp: , Rfl:    levofloxacin  (LEVAQUIN ) 500 MG tablet, Take 1 tablet (500 mg total) by mouth daily. (Patient not taking: Reported on 02/18/2024), Disp: 7 tablet, Rfl: 1   megestrol (MEGACE) 20 MG tablet, Take 20 mg by mouth daily. (Patient not taking: Reported on 02/18/2024), Disp: , Rfl:    ondansetron  (ZOFRAN ) 4 MG tablet, Take by mouth. (Patient not taking:  Reported on 02/18/2024), Disp: , Rfl:    rOPINIRole  (REQUIP ) 1 MG tablet, One or two po qHS, Disp: 180 tablet, Rfl: 3   Vitamin D , Ergocalciferol , (DRISDOL ) 1.25 MG (50000 UNIT) CAPS capsule, Take 1 capsule (50,000 Units total) by mouth every 7 (seven) days. (Patient not taking: Reported on 02/18/2024), Disp: 13 capsule, Rfl: 1  PAST MEDICAL HISTORY: Past Medical History:  Diagnosis Date   Diabetes mellitus without complication (HCC)    GERD (gastroesophageal reflux disease)    PONV (postoperative nausea and vomiting)    Prostate cancer (HCC)     PAST SURGICAL HISTORY: Past Surgical History:  Procedure Laterality Date   HERNIA REPAIR     groin as infant   NOSE SURGERY     PROSTATE BIOPSY N/A 02/28/2021   Procedure: PROSTATE BIOPSY URONAV PROSTATE;  Surgeon: Rea Cambridge, MD;  Location: [facility] ORS;  Service: Urology;  Laterality: N/A;   TOENAIL EXCISION      FAMILY HISTORY: History reviewed. No pertinent family history.  SOCIAL HISTORY:  Social History   Socioeconomic History   Marital status: Married    Spouse name: Not on file   Number of children: Not on file   Years of education: Not on file   Highest education level: Not on file  Occupational History   Not on file  Tobacco Use   Smoking status: Never   Smokeless tobacco: Never  Vaping Use   Vaping status: Never Used  Substance and Sexual Activity   Alcohol use: Not Currently   Drug use: Never   Sexual activity: Not on file  Other Topics Concern   Not on file  Social History Narrative   Not on file   Social Drivers of Health  Financial Resource Strain: Not on file  Food Insecurity: Low Risk  (10/30/2023)   Received from [facility]   Hunger Vital Sign    Worried About Running Out of Food in the Last Year: Never true    Ran Out of Food in the Last Year: Never true  Transportation Needs: No Transportation Needs (10/30/2023)   Received from [facility]    In the past 12 months, has lack  of reliable transportation kept you from medical appointments, meetings, work or from getting things needed for daily living? : No  Physical Activity: Not on file  Stress: Not on file  Social Connections: Not on file  Intimate Partner Violence: Not on file     PHYSICAL EXAM  Vitals:   02/18/24 1531  BP: 139/74  Pulse: 75  Weight: 195 lb (88.5 kg)  Height: 5\' 7"  (1.702 m)    Body mass index is 30.54 kg/m.   General: The patient is well-developed and well-nourished and in no acute distress  HEENT:  Head is [place]/AT.  Sclera are anicteric.     Skin: Extremities are without rash or  edema.  Musculoskeletal:  Back is nontender  Neurologic Exam  Mental status: He scored 22/30 on the [facility] cognitive assessment (mild cognitive impairment) speech is normal.  Cranial nerves: Extraocular movements are full.  Facial strength and sensation was normal.  No obvious hearing deficits are noted.  Motor: Slight intention tremor in hands.  Muscle bulk is normal.  Muscle tone is normal.  Strength is  5 / 5 in all 4 extremities.   Sensory: Sensory testing is intact to pinprick, soft touch and vibration sensation in all 4 extremities.  Coordination: Cerebellar testing reveals good finger-nose-finger and heel-to-shin bilaterally.  Gait and station: Station is normal.  Gait is arthritic and slightly wide.  Tandem gait is  wide.  He does not not have retropulsion.Aaron Aas  He can turn in 3-4 steps.  Romberg is negative.   Reflexes: Deep tendon reflexes are symmetric and normal bilaterally.      DIAGNOSTIC DATA (LABS, IMAGING, TESTING) - I reviewed patient records, labs, notes, testing and imaging myself where available.  Lab Results  Component Value Date   WBC 8.0 01/27/2022   HGB 13.2 01/27/2022   HCT 39.6 01/27/2022   MCV 85.0 01/27/2022   PLT 278 01/27/2022      Component Value Date/Time   NA 139 01/27/2022 1027   K 3.8 01/27/2022 1027   CL 109 01/27/2022 1027   CO2 17 (L)  01/27/2022 1027   GLUCOSE 160 (H) 01/27/2022 1027   BUN 19 01/27/2022 1027   CREATININE 1.34 (H) 01/27/2022 1027   CALCIUM 9.6 01/27/2022 1027   PROT 6.9 01/27/2022 1027   ALBUMIN 3.8 01/27/2022 1027   AST 16 01/27/2022 1027   ALT 12 01/27/2022 1027   ALKPHOS 68 01/27/2022 1027   BILITOT 0.7 01/27/2022 1027   GFRNONAA 60 (L) 01/27/2022 1027       ASSESSMENT AND PLAN  Alzheimer disease (HCC)  OSA (obstructive sleep apnea)  RLS (restless legs syndrome) - Plan: Iron, TIBC and Ferritin Panel  Depression with anxiety  Vitamin D  deficiency  Iron deficiency - Plan: Iron, TIBC and Ferritin Panel   His Alzheimer's is stable.  He will continue lecanemab for AD.   The Three  MRI's for ARIA were fine.   Last 1 personally reviewed today.  We also discussed trying to be socially, cognitively, and   physically active  Restless leg syndrome is worse.  Will increase the ropinirole .  Check iron and ferritin levels.  Increase iron dose or refer to hematology if deficiency worsening Though they will be moving to [place] , we will continue infusions with us  until he is firmly established at a different site return in 6 months or sooner if there are new or worsening neurologic symptoms.  He had an appointment that they needed to cancel and will make another appointment in [place] , hopefully soon.   This visit is part of a comprehensive longitudinal care medical relationship regarding the patients primary diagnosis of Alzheimer's disease and related concerns.   40-minute office visit with the majority of the time spent face-to-face for history and physical, discussion/counseling and decision-making.  Additional time with record and imaging review and documentation.   Hildegard Hlavac A. Godwin Lat, MD, Laurita Porta 02/25/2024, 2:06 PM Certified in Neurology, Clinical Neurophysiology, Sleep Medicine and Neuroimaging  [facility] [address], [address] [place], [place] [address] [phone]

## 2024-02-19 ENCOUNTER — Ambulatory Visit: Payer: Self-pay | Admitting: Neurology

## 2024-02-19 LAB — IRON,TIBC AND FERRITIN PANEL
Ferritin: 68 ng/mL (ref 30–400)
Iron Saturation: 21 % (ref 15–55)
Iron: 63 ug/dL (ref 38–169)
Total Iron Binding Capacity: 300 ug/dL (ref 250–450)
UIBC: 237 ug/dL (ref 111–343)

## 2024-03-30 ENCOUNTER — Encounter: Payer: Self-pay | Admitting: Neurology

## 2024-04-10 ENCOUNTER — Other Ambulatory Visit: Payer: Self-pay | Admitting: Neurology

## 2024-04-11 ENCOUNTER — Other Ambulatory Visit: Payer: Self-pay | Admitting: *Deleted

## 2024-04-11 DIAGNOSIS — G3184 Mild cognitive impairment, so stated: Secondary | ICD-10-CM

## 2024-04-11 DIAGNOSIS — F0280 Dementia in other diseases classified elsewhere without behavioral disturbance: Secondary | ICD-10-CM

## 2024-04-11 DIAGNOSIS — G4733 Obstructive sleep apnea (adult) (pediatric): Secondary | ICD-10-CM

## 2024-04-11 DIAGNOSIS — R413 Other amnesia: Secondary | ICD-10-CM

## 2024-04-11 NOTE — Telephone Encounter (Signed)
 Last seen on 02/18/24 No 6 month follow up scheduled

## 2024-04-12 ENCOUNTER — Telehealth: Payer: Self-pay | Admitting: Neurology

## 2024-04-12 NOTE — Telephone Encounter (Signed)
 Referral for neurology fax to [facility]. Phone: [phone], Fax: [phone]

## 2024-04-18 ENCOUNTER — Other Ambulatory Visit: Payer: Self-pay | Admitting: Neurology

## 2024-04-19 NOTE — Telephone Encounter (Signed)
 Called [facility] at [phone]. Spoke w/ Daria. Confirmed they can infuse Leqembi. She transferred me to see if they can take orders from Dr. Vear for pt to do home infusions in [place]. Spoke w/ specialty team/Kim. They can accept order for drug: Leqembi from out of state provider. The issue is they cannot accept nursing orders from out of state provider.  If pt has PCP in [place], they can see if they would be willing to write for the nursing orders. If unable, pt will need in state provider to write order for drug/nursing order.

## 2024-04-20 ENCOUNTER — Other Ambulatory Visit: Payer: Self-pay | Admitting: *Deleted

## 2024-04-20 ENCOUNTER — Encounter: Payer: Self-pay | Admitting: *Deleted

## 2024-04-20 DIAGNOSIS — R413 Other amnesia: Secondary | ICD-10-CM

## 2024-04-20 DIAGNOSIS — F0280 Dementia in other diseases classified elsewhere without behavioral disturbance: Secondary | ICD-10-CM

## 2024-04-20 NOTE — Telephone Encounter (Signed)
 Spoke w/ Debra/medical records. She will check with billing to see if form paid for. If not, she will f/u with wife to get payment.

## 2024-04-20 NOTE — Telephone Encounter (Signed)
 Referral for Neurology faxed to [facility]urology  [facility]  [phone] Fax:[phone]

## 2024-04-21 ENCOUNTER — Encounter: Payer: Self-pay | Admitting: *Deleted

## 2024-04-27 NOTE — Telephone Encounter (Signed)
 Annie-we placed a new referral on 04/20/24 to a different doctor per wife request. Can you make sure this gets sent also? They are trying to see if he can get in sooner here

## 2024-04-27 NOTE — Telephone Encounter (Signed)
 Contacted [facility] spoke with scheduler, Zelda. Have referral waiting to be reviewed by physician before scheduling appointment.

## 2024-04-28 DIAGNOSIS — Z0289 Encounter for other administrative examinations: Secondary | ICD-10-CM

## 2024-04-28 NOTE — Telephone Encounter (Signed)
 Noted

## 2024-05-02 ENCOUNTER — Telehealth: Payer: Self-pay

## 2024-05-02 NOTE — Telephone Encounter (Signed)
 Gave pt's signed FMLA Paperwork to Adrien in medical records.

## 2024-08-01 LAB — COLOGUARD: COLOGUARD: NEGATIVE

## 2024-08-20 ENCOUNTER — Other Ambulatory Visit: Payer: Self-pay | Admitting: Neurology

## 2024-10-18 ENCOUNTER — Telehealth: Payer: Self-pay | Admitting: Neurology

## 2024-10-18 NOTE — Telephone Encounter (Signed)
[place] form faxed
